# Patient Record
Sex: Female | Born: 1980 | Race: White | Hispanic: No | Marital: Married | Smoking: Never smoker
Health system: Southern US, Community
[De-identification: ages and names within clinical notes are randomized; demographics above are authoritative.]

## PROBLEM LIST (undated history)

## (undated) DIAGNOSIS — A630 Anogenital (venereal) warts: Secondary | ICD-10-CM

## (undated) DIAGNOSIS — E7211 Homocystinuria: Secondary | ICD-10-CM

## (undated) DIAGNOSIS — E7212 Methylenetetrahydrofolate reductase deficiency: Secondary | ICD-10-CM

## (undated) HISTORY — PX: DILATION AND CURETTAGE OF UTERUS: SHX78

## (undated) HISTORY — DX: Anogenital (venereal) warts: A63.0

## (undated) HISTORY — DX: Methylenetetrahydrofolate reductase deficiency: E72.12

## (undated) HISTORY — DX: Homocystinuria: E72.11

---

## 2011-08-17 ENCOUNTER — Encounter (HOSPITAL_COMMUNITY): Payer: Self-pay | Admitting: Emergency Medicine

## 2011-08-17 ENCOUNTER — Emergency Department (HOSPITAL_COMMUNITY)
Admission: EM | Admit: 2011-08-17 | Discharge: 2011-08-17 | Disposition: A | Discharge status: Home / Self Care | Payer: BC Managed Care – PPO | Source: Home / Self Care | Attending: Emergency Medicine | Admitting: Emergency Medicine

## 2011-08-17 DIAGNOSIS — IMO0002 Reserved for concepts with insufficient information to code with codable children: Secondary | ICD-10-CM

## 2011-08-17 DIAGNOSIS — T148XXA Other injury of unspecified body region, initial encounter: Secondary | ICD-10-CM

## 2011-08-17 MED ORDER — TRAMADOL HCL 50 MG PO TABS: 100.0000 mg | ORAL_TABLET | Freq: Three times a day (TID) | ORAL | Status: AC | PRN

## 2011-08-17 NOTE — ED Notes (Signed)
Last tetanus December 2012

## 2011-08-17 NOTE — ED Notes (Signed)
Left index finger tip injured with a knife while preparing meal.  Small area of avulsion on finger tip.  Bleeding controlled .  Incident occurred around 5:30 tonight.  Rinsed with water and applied neosporin .  Patient was cutting an onion.

## 2011-08-17 NOTE — ED Notes (Signed)
Finger soaking in

## 2011-08-17 NOTE — ED Notes (Signed)
Finger has been soaked in chlorehexadine

## 2011-08-17 NOTE — Discharge Instructions (Signed)

## 2011-08-17 NOTE — ED Provider Notes (Addendum)
Chief Complaint  Patient presents with  . 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 She has a small area of avulsion of a portion the fingertip. Bleeding has been controlled. Her last Tdap vaccine was in December of last year.  Review of Systems:  Other than noted above, the patient denies any of the following symptoms: Systemic:  No fever or chills. Musculoskeletal:  No joint pain or decreased range of motion. Neuro:  No numbness, tingling, or weakness.  PMFSH:  Past medical history, family history, social history, meds, and allergies were reviewed.  Physical Exam:   Vital signs:  BP 129/80  Pulse 84  Temp(Src) 98.5 F (36.9 C) (Oral)  Resp 18  SpO2 99%  LMP 08/10/2011 Ext:  There was a tiny area of avulsion on the left index finger involving the finger nail measuring no more than 8 mm in size.  All joints had a full ROM without pain.  Pulses were full.  Good capillary refill in all digits.  No edema. Neurological:  Alert and oriented.  No muscle weakness.  Sensation was intact to light touch.   Assessment:  The encounter diagnosis was Laceration. This does not require suturing. Antibiotic ointment was applied and a sterile dressing. She was instructed in wound care.  Plan:   1.  The following meds were prescribed:   New Prescriptions   TRAMADOL (ULTRAM) 50 MG TABLET    Take 2 tablets (100 mg total) by mouth every 8 (eight) hours as needed for pain.   TRAMADOL (ULTRAM) 50 MG TABLET    Take 2 tablets (100 mg total) by mouth every 8 (eight) hours as needed for pain.   2.  The patient was instructed in wound care and pain control, and handouts were given.   Reuben Likes, MD 08/17/11 2025  Reuben Likes, MD 08/20/11 1101  Reuben Likes, MD 08/20/11 1101

## 2012-11-23 ENCOUNTER — Other Ambulatory Visit (HOSPITAL_COMMUNITY): Payer: Self-pay | Admitting: Obstetrics

## 2012-11-23 DIAGNOSIS — IMO0002 Reserved for concepts with insufficient information to code with codable children: Secondary | ICD-10-CM

## 2012-11-29 ENCOUNTER — Ambulatory Visit (HOSPITAL_COMMUNITY)
Admission: RE | Admit: 2012-11-29 | Discharge: 2012-11-29 | Disposition: A | Discharge status: Home / Self Care | Payer: BC Managed Care – PPO | Source: Ambulatory Visit | Attending: Obstetrics | Admitting: Obstetrics

## 2012-11-29 DIAGNOSIS — N979 Female infertility, unspecified: Secondary | ICD-10-CM | POA: Insufficient documentation

## 2012-11-29 DIAGNOSIS — IMO0002 Reserved for concepts with insufficient information to code with codable children: Secondary | ICD-10-CM

## 2012-11-29 MED ORDER — IOHEXOL 300 MG/ML  SOLN
10.0000 mL | Freq: Once | INTRAMUSCULAR | Status: AC | PRN
  Administered 2012-11-29: 10 mL

## 2014-03-03 LAB — OB RESULTS CONSOLE ANTIBODY SCREEN: Antibody Screen: POSITIVE

## 2014-03-03 LAB — OB RESULTS CONSOLE ABO/RH: RH Type: NEGATIVE

## 2014-03-03 LAB — OB RESULTS CONSOLE HIV ANTIBODY (ROUTINE TESTING): HIV: NONREACTIVE

## 2014-03-03 LAB — OB RESULTS CONSOLE RUBELLA ANTIBODY, IGM: RUBELLA: IMMUNE

## 2014-03-03 LAB — OB RESULTS CONSOLE RPR: RPR: NONREACTIVE

## 2014-03-03 LAB — OB RESULTS CONSOLE HEPATITIS B SURFACE ANTIGEN: HEP B S AG: NEGATIVE

## 2014-03-17 NOTE — L&D Delivery Note (Signed)
Delivery Note At 5:14 PM a viable and healthy female was delivered via Vaginal, Spontaneous Delivery (Presentation: Right Occiput Anterior).  APGAR: 8, 9; weight  pending.   Placenta status: Intact, Spontaneous.  Cord: 3 vessels with the following complications: None.  Cord pH: na  Anesthesia: Epidural  Episiotomy:  none Lacerations:  third Suture Repair: 2.0 3.0 vicryl rapide, 0 vicryl Est. Blood Loss (mL):  300  Mom to postpartum.  Baby to Couplet care / Skin to Skin.  Starlena Beil J 10/03/2014, 6:00 PM

## 2014-03-21 LAB — OB RESULTS CONSOLE GC/CHLAMYDIA
CHLAMYDIA, DNA PROBE: NEGATIVE
GC PROBE AMP, GENITAL: NEGATIVE

## 2014-09-07 LAB — OB RESULTS CONSOLE GBS: GBS: NEGATIVE

## 2014-10-02 ENCOUNTER — Encounter (HOSPITAL_COMMUNITY): Payer: Self-pay | Admitting: *Deleted

## 2014-10-02 ENCOUNTER — Inpatient Hospital Stay (HOSPITAL_COMMUNITY)
Admission: AD | Admit: 2014-10-02 | Discharge: 2014-10-05 | DRG: 988 | Disposition: A | Discharge status: Home / Self Care | Payer: BLUE CROSS/BLUE SHIELD | Source: Ambulatory Visit | Attending: Obstetrics and Gynecology | Admitting: Obstetrics and Gynecology

## 2014-10-02 DIAGNOSIS — O09819 Supervision of pregnancy resulting from assisted reproductive technology, unspecified trimester: Secondary | ICD-10-CM | POA: Diagnosis not present

## 2014-10-02 DIAGNOSIS — O365930 Maternal care for other known or suspected poor fetal growth, third trimester, not applicable or unspecified: Principal | ICD-10-CM | POA: Diagnosis present

## 2014-10-02 DIAGNOSIS — O9902 Anemia complicating childbirth: Secondary | ICD-10-CM | POA: Diagnosis present

## 2014-10-02 DIAGNOSIS — O4103X0 Oligohydramnios, third trimester, not applicable or unspecified: Secondary | ICD-10-CM | POA: Diagnosis present

## 2014-10-02 DIAGNOSIS — O26899 Other specified pregnancy related conditions, unspecified trimester: Secondary | ICD-10-CM | POA: Diagnosis present

## 2014-10-02 DIAGNOSIS — Z3A39 39 weeks gestation of pregnancy: Secondary | ICD-10-CM | POA: Diagnosis present

## 2014-10-02 DIAGNOSIS — D62 Acute posthemorrhagic anemia: Secondary | ICD-10-CM | POA: Diagnosis not present

## 2014-10-02 DIAGNOSIS — Z6791 Unspecified blood type, Rh negative: Secondary | ICD-10-CM

## 2014-10-02 DIAGNOSIS — IMO0002 Reserved for concepts with insufficient information to code with codable children: Secondary | ICD-10-CM | POA: Diagnosis present

## 2014-10-02 MED ORDER — LIDOCAINE HCL (PF) 1 % IJ SOLN
30.0000 mL | INTRAMUSCULAR | Status: DC | PRN
  Administered 2014-10-03: 30 mL via SUBCUTANEOUS
  Filled 2014-10-02: qty 30

## 2014-10-02 MED ORDER — LACTATED RINGERS IV SOLN
INTRAVENOUS | Status: DC
  Administered 2014-10-03: 16:00:00 via INTRAVENOUS

## 2014-10-02 MED ORDER — OXYTOCIN 40 UNITS IN LACTATED RINGERS INFUSION - SIMPLE MED
1.0000 m[IU]/min | INTRAVENOUS | Status: DC
  Administered 2014-10-03: 2 m[IU]/min via INTRAVENOUS
  Filled 2014-10-02: qty 1000

## 2014-10-02 MED ORDER — LACTATED RINGERS IV SOLN
500.0000 mL | INTRAVENOUS | Status: DC | PRN
  Administered 2014-10-03: 500 mL via INTRAVENOUS

## 2014-10-02 MED ORDER — ONDANSETRON HCL 4 MG/2ML IJ SOLN: 4.0000 mg | Freq: Four times a day (QID) | INTRAMUSCULAR | Status: DC | PRN

## 2014-10-02 MED ORDER — FLEET ENEMA 7-19 GM/118ML RE ENEM: 1.0000 | ENEMA | Freq: Every day | RECTAL | Status: DC | PRN

## 2014-10-02 MED ORDER — OXYCODONE-ACETAMINOPHEN 5-325 MG PO TABS: 1.0000 | ORAL_TABLET | ORAL | Status: DC | PRN

## 2014-10-02 MED ORDER — ZOLPIDEM TARTRATE 5 MG PO TABS: 5.0000 mg | ORAL_TABLET | Freq: Every evening | ORAL | Status: DC | PRN

## 2014-10-02 MED ORDER — MISOPROSTOL 25 MCG QUARTER TABLET
25.0000 ug | ORAL_TABLET | ORAL | Status: DC | PRN
  Administered 2014-10-03 (×2): 25 ug via VAGINAL
  Filled 2014-10-02 (×2): qty 0.25
  Filled 2014-10-02: qty 1

## 2014-10-02 MED ORDER — OXYTOCIN 40 UNITS IN LACTATED RINGERS INFUSION - SIMPLE MED
62.5000 mL/h | INTRAVENOUS | Status: DC
  Administered 2014-10-03: 62.5 mL/h via INTRAVENOUS

## 2014-10-02 MED ORDER — TERBUTALINE SULFATE 1 MG/ML IJ SOLN: 0.2500 mg | Freq: Once | INTRAMUSCULAR | Status: AC | PRN

## 2014-10-02 MED ORDER — OXYCODONE-ACETAMINOPHEN 5-325 MG PO TABS: 2.0000 | ORAL_TABLET | ORAL | Status: DC | PRN

## 2014-10-02 MED ORDER — OXYTOCIN BOLUS FROM INFUSION: 500.0000 mL | INTRAVENOUS | Status: DC

## 2014-10-02 MED ORDER — ACETAMINOPHEN 325 MG PO TABS: 650.0000 mg | ORAL_TABLET | ORAL | Status: DC | PRN

## 2014-10-02 MED ORDER — CITRIC ACID-SODIUM CITRATE 334-500 MG/5ML PO SOLN
30.0000 mL | ORAL | Status: DC | PRN
  Administered 2014-10-03: 30 mL via ORAL
  Filled 2014-10-02: qty 15

## 2014-10-02 NOTE — H&P (Signed)
Rachel Hernandez is a 34 y.o. female presenting for IOL for IUGR and oligo.  Maternal Medical History:  Contractions: Onset was less than 1 hour ago.   Frequency: rare.   Perceived severity is mild.    Fetal activity: Perceived fetal activity is normal.   Last perceived fetal movement was within the past hour.    Prenatal complications: IUGR and oligohydramnios.   Prenatal Complications - Diabetes: none.    OB History    No data available     No past medical history on file. No past surgical history on file. Family History: family history is not on file. Social History:  reports that she has never smoked. She does not have any smokeless tobacco history on file. She reports that she drinks alcohol. She reports that she does not use illicit drugs.   Prenatal Transfer Tool  Maternal Diabetes: No Genetic Screening: Normal Maternal Ultrasounds/Referrals: Normal Fetal Ultrasounds or other Referrals:  None Maternal Substance Abuse:  No Significant Maternal Medications:  None Significant Maternal Lab Results:  None Other Comments:  None  Review of Systems  Constitutional: Negative.   All other systems reviewed and are negative.     There were no vitals taken for this visit. Maternal Exam:  Uterine Assessment: Contraction strength is mild.  Contraction frequency is irregular.   Abdomen: Patient reports no abdominal tenderness. Fetal presentation: vertex  Introitus: Normal vulva. Normal vagina.  Ferning test: not done.  Nitrazine test: not done. Amniotic fluid character: not assessed.  Pelvis: adequate for delivery.   Cervix: Cervix evaluated by digital exam.     Physical Exam  Nursing note and vitals reviewed. Constitutional: She is oriented to person, place, and time. She appears well-developed and well-nourished.  HENT:  Head: Normocephalic and atraumatic.  Neck: Normal range of motion. Neck supple.  Cardiovascular: Normal rate and regular rhythm.   Respiratory:  Effort normal and breath sounds normal.  GI: Soft. Bowel sounds are normal.  Genitourinary: Vagina normal and uterus normal.  Musculoskeletal: Normal range of motion.  Neurological: She is alert and oriented to person, place, and time. She has normal reflexes.  Skin: Skin is warm and dry.  Psychiatric: She has a normal mood and affect. Her behavior is normal. Thought content normal.    Prenatal labs: ABO, Rh: A/Negative/-- (12/18 0000) Antibody: Positive (12/18 0000) Rubella: Immune (12/18 0000) RPR: Nonreactive (12/18 0000)  HBsAg: Negative (12/18 0000)  HIV: Non-reactive (12/18 0000)  GBS: Negative (06/23 0000)   Assessment/Plan: 39 weeks IUGR with lagging AC Oligo IVF pregnancy Proceed with IOL   Rachel Hernandez 10/02/2014, 10:39 PM

## 2014-10-03 ENCOUNTER — Inpatient Hospital Stay (HOSPITAL_COMMUNITY): Payer: BLUE CROSS/BLUE SHIELD | Admitting: Anesthesiology

## 2014-10-03 ENCOUNTER — Encounter (HOSPITAL_COMMUNITY): Payer: Self-pay | Admitting: *Deleted

## 2014-10-03 LAB — CBC
HCT: 38.9 % (ref 36.0–46.0)
Hemoglobin: 13 g/dL (ref 12.0–15.0)
MCH: 30 pg (ref 26.0–34.0)
MCHC: 33.4 g/dL (ref 30.0–36.0)
MCV: 89.6 fL (ref 78.0–100.0)
PLATELETS: 211 10*3/uL (ref 150–400)
RBC: 4.34 MIL/uL (ref 3.87–5.11)
RDW: 13.9 % (ref 11.5–15.5)
WBC: 15.4 10*3/uL — ABNORMAL HIGH (ref 4.0–10.5)

## 2014-10-03 LAB — RPR: RPR: NONREACTIVE

## 2014-10-03 MED ORDER — METHYLERGONOVINE MALEATE 0.2 MG PO TABS: 0.2000 mg | ORAL_TABLET | ORAL | Status: DC | PRN

## 2014-10-03 MED ORDER — WITCH HAZEL-GLYCERIN EX PADS: 1.0000 "application " | MEDICATED_PAD | CUTANEOUS | Status: DC | PRN

## 2014-10-03 MED ORDER — OXYCODONE-ACETAMINOPHEN 5-325 MG PO TABS
2.0000 | ORAL_TABLET | ORAL | Status: DC | PRN
  Administered 2014-10-04 (×2): 2 via ORAL
  Filled 2014-10-03 (×2): qty 2

## 2014-10-03 MED ORDER — SENNOSIDES-DOCUSATE SODIUM 8.6-50 MG PO TABS
2.0000 | ORAL_TABLET | ORAL | Status: DC
  Administered 2014-10-03 – 2014-10-04 (×2): 2 via ORAL
  Filled 2014-10-03 (×2): qty 2

## 2014-10-03 MED ORDER — DIPHENHYDRAMINE HCL 50 MG/ML IJ SOLN: 12.5000 mg | INTRAMUSCULAR | Status: DC | PRN

## 2014-10-03 MED ORDER — ONDANSETRON HCL 4 MG PO TABS
4.0000 mg | ORAL_TABLET | ORAL | Status: DC | PRN
Start: 2014-10-03 — End: 2014-10-05

## 2014-10-03 MED ORDER — TETANUS-DIPHTH-ACELL PERTUSSIS 5-2.5-18.5 LF-MCG/0.5 IM SUSP: 0.5000 mL | Freq: Once | INTRAMUSCULAR | Status: DC

## 2014-10-03 MED ORDER — DIPHENHYDRAMINE HCL 25 MG PO CAPS: 25.0000 mg | ORAL_CAPSULE | Freq: Four times a day (QID) | ORAL | Status: DC | PRN

## 2014-10-03 MED ORDER — ZOLPIDEM TARTRATE 5 MG PO TABS: 5.0000 mg | ORAL_TABLET | Freq: Every evening | ORAL | Status: DC | PRN

## 2014-10-03 MED ORDER — PRENATAL MULTIVITAMIN CH
1.0000 | ORAL_TABLET | Freq: Every day | ORAL | Status: DC
  Administered 2014-10-04 – 2014-10-05 (×2): 1 via ORAL
  Filled 2014-10-03 (×2): qty 1

## 2014-10-03 MED ORDER — IBUPROFEN 600 MG PO TABS
600.0000 mg | ORAL_TABLET | Freq: Four times a day (QID) | ORAL | Status: DC
  Administered 2014-10-03 – 2014-10-05 (×8): 600 mg via ORAL
  Filled 2014-10-03 (×8): qty 1

## 2014-10-03 MED ORDER — BENZOCAINE-MENTHOL 20-0.5 % EX AERO
1.0000 "application " | INHALATION_SPRAY | CUTANEOUS | Status: DC | PRN
  Filled 2014-10-03 (×2): qty 56

## 2014-10-03 MED ORDER — OXYCODONE-ACETAMINOPHEN 5-325 MG PO TABS
1.0000 | ORAL_TABLET | ORAL | Status: DC | PRN
  Administered 2014-10-04 – 2014-10-05 (×6): 1 via ORAL
  Filled 2014-10-03 (×6): qty 1

## 2014-10-03 MED ORDER — METHYLERGONOVINE MALEATE 0.2 MG/ML IJ SOLN: 0.2000 mg | INTRAMUSCULAR | Status: DC | PRN

## 2014-10-03 MED ORDER — LANOLIN HYDROUS EX OINT: TOPICAL_OINTMENT | CUTANEOUS | Status: DC | PRN

## 2014-10-03 MED ORDER — FENTANYL 2.5 MCG/ML BUPIVACAINE 1/10 % EPIDURAL INFUSION (WH - ANES)
14.0000 mL/h | INTRAMUSCULAR | Status: DC | PRN
  Administered 2014-10-03 (×2): 14 mL/h via EPIDURAL
  Filled 2014-10-03 (×2): qty 125

## 2014-10-03 MED ORDER — SIMETHICONE 80 MG PO CHEW: 80.0000 mg | CHEWABLE_TABLET | ORAL | Status: DC | PRN

## 2014-10-03 MED ORDER — ONDANSETRON HCL 4 MG/2ML IJ SOLN: 4.0000 mg | INTRAMUSCULAR | Status: DC | PRN

## 2014-10-03 MED ORDER — EPHEDRINE 5 MG/ML INJ
10.0000 mg | INTRAVENOUS | Status: DC | PRN
  Filled 2014-10-03: qty 2

## 2014-10-03 MED ORDER — LIDOCAINE HCL (PF) 1 % IJ SOLN
INTRAMUSCULAR | Status: DC | PRN
  Administered 2014-10-03: 2 mL
  Administered 2014-10-03 (×2): 4 mL

## 2014-10-03 MED ORDER — DIBUCAINE 1 % RE OINT: 1.0000 "application " | TOPICAL_OINTMENT | RECTAL | Status: DC | PRN

## 2014-10-03 MED ORDER — ACETAMINOPHEN 325 MG PO TABS: 650.0000 mg | ORAL_TABLET | ORAL | Status: DC | PRN

## 2014-10-03 MED ORDER — PHENYLEPHRINE 40 MCG/ML (10ML) SYRINGE FOR IV PUSH (FOR BLOOD PRESSURE SUPPORT)
80.0000 ug | PREFILLED_SYRINGE | INTRAVENOUS | Status: DC | PRN
  Filled 2014-10-03: qty 2
  Filled 2014-10-03: qty 20

## 2014-10-03 NOTE — Anesthesia Procedure Notes (Deleted)
Epidural Patient location during procedure: OB Start time: 10/03/2014 10:23 AM  Staffing Anesthesiologist: Parissa Chiao Performed by: anesthesiologist   Preanesthetic Checklist Completed: patient identified, site marked, surgical consent, pre-op evaluation, timeout performed, IV checked, risks and benefits discussed and monitors and equipment checked  Epidural Patient position: sitting Prep: site prepped and draped and DuraPrep Patient monitoring: continuous pulse ox and blood pressure Approach: midline Injection technique: LOR air  Needle:  Needle type: Tuohy  Needle gauge: 17 G Needle length: 9 cm and 9 Needle insertion depth: 5 cm cm Catheter type: closed end flexible Catheter size: 19 Gauge Catheter at skin depth: 10 cm Test dose: negative  Assessment Events: blood not aspirated, injection not painful, no injection resistance, negative IV test and no paresthesia

## 2014-10-03 NOTE — Anesthesia Preprocedure Evaluation (Signed)
Anesthesia Evaluation  Patient identified by MRN, date of birth, ID band Patient awake    Reviewed: Allergy & Precautions, H&P , NPO status , Patient's Chart, lab work & pertinent test results, reviewed documented beta blocker date and time   Airway Mallampati: II  TM Distance: >3 FB Neck ROM: full    Dental no notable dental hx.    Pulmonary neg pulmonary ROS,  breath sounds clear to auscultation  Pulmonary exam normal       Cardiovascular negative cardio ROS Normal cardiovascular examRhythm:regular Rate:Normal     Neuro/Psych negative neurological ROS  negative psych ROS   GI/Hepatic negative GI ROS, Neg liver ROS,   Endo/Other  negative endocrine ROS  Renal/GU negative Renal ROS  negative genitourinary   Musculoskeletal   Abdominal   Peds  Hematology negative hematology ROS (+)   Anesthesia Other Findings NPO appropriate, allergies reviewed Denies active cardiac or pulmonary symptoms, METS > 4 No recent congestive cough or symptoms of upper respiratory infection Meds - none   Reproductive/Obstetrics (+) Pregnancy                             Anesthesia Physical Anesthesia Plan  ASA: II  Anesthesia Plan: Epidural   Post-op Pain Management:    Induction:   Airway Management Planned:   Additional Equipment:   Intra-op Plan:   Post-operative Plan:   Informed Consent: I have reviewed the patients History and Physical, chart, labs and discussed the procedure including the risks, benefits and alternatives for the proposed anesthesia with the patient or authorized representative who has indicated his/her understanding and acceptance.     Plan Discussed with:   Anesthesia Plan Comments:         Anesthesia Quick Evaluation

## 2014-10-03 NOTE — Anesthesia Preprocedure Evaluation (Deleted)
Anesthesia Evaluation  Patient identified by MRN, date of birth, ID band Patient awake    Reviewed: Allergy & Precautions, H&P , NPO status , Patient's Chart, lab work & pertinent test results, reviewed documented beta blocker date and time   Airway Mallampati: II  TM Distance: >3 FB Neck ROM: full    Dental no notable dental hx.    Pulmonary neg pulmonary ROS,  breath sounds clear to auscultation  Pulmonary exam normal       Cardiovascular negative cardio ROS Normal cardiovascular examRhythm:regular Rate:Normal     Neuro/Psych negative neurological ROS  negative psych ROS   GI/Hepatic negative GI ROS, Neg liver ROS,   Endo/Other  negative endocrine ROS  Renal/GU negative Renal ROS  negative genitourinary   Musculoskeletal   Abdominal   Peds  Hematology negative hematology ROS (+)   Anesthesia Other Findings   Reproductive/Obstetrics (+) Pregnancy                             Anesthesia Physical Anesthesia Plan  ASA: II  Anesthesia Plan: Epidural   Post-op Pain Management:    Induction:   Airway Management Planned:   Additional Equipment:   Intra-op Plan:   Post-operative Plan:   Informed Consent: I have reviewed the patients History and Physical, chart, labs and discussed the procedure including the risks, benefits and alternatives for the proposed anesthesia with the patient or authorized representative who has indicated his/her understanding and acceptance.     Plan Discussed with:   Anesthesia Plan Comments:         Anesthesia Quick Evaluation

## 2014-10-03 NOTE — Anesthesia Procedure Notes (Signed)
Epidural Patient location during procedure: OB  Staffing Anesthesiologist: Traeton Bordas Performed by: anesthesiologist   Preanesthetic Checklist Completed: patient identified, site marked, surgical consent, pre-op evaluation, timeout performed, IV checked, risks and benefits discussed and monitors and equipment checked  Epidural Patient position: sitting Prep: site prepped and draped and DuraPrep Patient monitoring: continuous pulse ox and blood pressure Approach: midline Location: L3-L4 Injection technique: LOR air  Needle:  Needle type: Tuohy  Needle gauge: 17 G Needle length: 9 cm and 9 Needle insertion depth: 5 cm cm Catheter type: closed end flexible Catheter size: 19 Gauge Catheter at skin depth: 9 cm Test dose: negative  Assessment Events: blood not aspirated, injection not painful, no injection resistance, negative IV test and no paresthesia  Additional Notes Patient identified. Risks/Benefits/Options discussed with patient including but not limited to bleeding, infection, nerve damage, paralysis, failed block, incomplete pain control, headache, blood pressure changes, nausea, vomiting, reactions to medication both or allergic, itching and postpartum back pain. Confirmed with bedside nurse the patient's most recent platelet count. Confirmed with patient that they are not currently taking any anticoagulation, have any bleeding history or any family history of bleeding disorders. Patient expressed understanding and wished to proceed. All questions were answered. Sterile technique was used throughout the entire procedure. Please see nursing notes for vital signs. Test dose was given through epidural catheter and negative prior to continuing to dose epidural or start infusion. Warning signs of high block given to the patient including shortness of breath, tingling/numbness in hands, complete motor block, or any concerning symptoms with instructions to call for help. Patient was  given instructions on fall risk and not to get out of bed. All questions and concerns addressed with instructions to call with any issues or inadequate analgesia.

## 2014-10-03 NOTE — Plan of Care (Signed)
Problem: Consults Goal: Birthing Suites Patient Information Press F2 to bring up selections list   Pt 37-[redacted] weeks EGA and Inpatient induction

## 2014-10-03 NOTE — Progress Notes (Signed)
Rachel Hernandez is a 34 y.o. G3P0020 at 10051w2d by LMP admitted for induction of labor due to Poor fetal growth.  Subjective: comfortable  Objective: BP 122/68 mmHg  Pulse 72  Temp(Src) 97.9 F (36.6 C) (Oral)  Resp 18  Ht 5\' 8"  (1.727 m)  Wt 83.008 kg (183 lb)  BMI 27.83 kg/m2  LMP 01/01/2014      FHT:  FHR: 155 bpm, variability: moderate,  accelerations:  Present,  decelerations:  Absent UC:   irregular, every 7-10 minutes SVE:   Dilation: 4 Effacement (%): 70 Station: -1 Exam by:: Rachel Hernandez  AROM - clear  Labs: Lab Results  Component Value Date   WBC 15.4* 10/02/2014   HGB 13.0 10/02/2014   HCT 38.9 10/02/2014   MCV 89.6 10/02/2014   PLT 211 10/02/2014    Assessment / Plan: Induction of labor due to IUGR,  progressing well on pitocin  Labor: Progressing normally Preeclampsia:  no signs or symptoms of toxicity Fetal Wellbeing:  Category I Pain Control:  Labor support without medications I/D:  n/a Anticipated MOD:  NSVD  Rachel Hernandez J 10/03/2014, 8:01 AM

## 2014-10-03 NOTE — Progress Notes (Signed)
Rachel Hernandez is a 34 y.o. G3P0020 at [redacted]w[redacted]d by LMP admitted for induction of labor due to Low amniotic fluid. and Poor fetal growth.  Subjective: Feels occ pressure  Objective: BP 93/57 mmHg  Pulse 79  Temp(Src) 99 F (37.2 C) (Oral)  Resp 22  Ht 5\' 8"  (1.727 m)  Wt 83.008 kg (183 lb)  BMI 27.83 kg/m2  LMP 01/01/2014      FHT:  FHR: 145 bpm, variability: moderate,  accelerations:  Present,  decelerations:  Present occ variable UC:   regular, every 2-4 minutes SVE:   Dilation: 10 Effacement (%): 100 Station: +1, +2 Exam by:: Montez MoritaErin Hampton, RNC  Labs: Lab Results  Component Value Date   WBC 15.4* 10/02/2014   HGB 13.0 10/02/2014   HCT 38.9 10/02/2014   MCV 89.6 10/02/2014   PLT 211 10/02/2014    Assessment / Plan: Induction of labor due to IUGR,  progressing well on pitocin  Labor: Progressing normally Preeclampsia:  no signs or symptoms of toxicity Fetal Wellbeing:  Category I and Category II Pain Control:  Epidural I/D:  n/a Anticipated MOD:  NSVD  Vonnie Ligman J 10/03/2014, 2:49 PM

## 2014-10-04 LAB — CBC
HEMATOCRIT: 31.6 % — AB (ref 36.0–46.0)
Hemoglobin: 10.5 g/dL — ABNORMAL LOW (ref 12.0–15.0)
MCH: 30.3 pg (ref 26.0–34.0)
MCHC: 33.2 g/dL (ref 30.0–36.0)
MCV: 91.1 fL (ref 78.0–100.0)
PLATELETS: 165 10*3/uL (ref 150–400)
RBC: 3.47 MIL/uL — ABNORMAL LOW (ref 3.87–5.11)
RDW: 14.1 % (ref 11.5–15.5)
WBC: 14.1 10*3/uL — ABNORMAL HIGH (ref 4.0–10.5)

## 2014-10-04 MED ORDER — RHO D IMMUNE GLOBULIN 1500 UNIT/2ML IJ SOSY
300.0000 ug | PREFILLED_SYRINGE | Freq: Once | INTRAMUSCULAR | Status: AC
  Administered 2014-10-04: 300 ug via INTRAVENOUS
  Filled 2014-10-04: qty 2

## 2014-10-04 NOTE — Anesthesia Postprocedure Evaluation (Signed)
Anesthesia Post Note  Patient: Rachel SieveAlexis Hernandez  Procedure(s) Performed: * No procedures listed *  Anesthesia type: Epidural  Patient location: Mother/Baby  Post pain: Pain level controlled  Post assessment: Post-op Vital signs reviewed  Last Vitals:  Filed Vitals:   10/04/14 0915  BP: 117/57  Pulse: 102  Temp: 36.9 C  Resp: 18    Post vital signs: Reviewed  Level of consciousness:alert  Complications: No apparent anesthesia complications

## 2014-10-04 NOTE — Progress Notes (Signed)
Patient ID: Rachel Hernandez, female   DOB: 1980/12/03, 34 y.o.   MRN: [id number] PPD # 1 SVD with 3rd degree laceration  S:  Reports feeling very sore             Tolerating po/ No nausea or vomiting             Bleeding is light             Pain minimally controlled with ibuprofen (OTC) and narcotic analgesics including Percocet - only taking 1 tablet             Up ad lib / ambulatory / voiding without difficulties    Newborn  Information for the patient's newborn:  Biagio BorgLackey, Girl Jenilyn [[id number]][[id number]]  female  breast feeding     O:  A & O x 3, in no apparent distress              VS:  Filed Vitals:   10/03/14 2100 10/04/14 0100 10/04/14 0630 10/04/14 0915  BP: 107/61 115/60 110/65 117/57  Pulse: 94 92 85 102  Temp: 98.6 F (37 C) 98.9 F (37.2 C) 98.2 F (36.8 C) 98.4 F (36.9 C)  TempSrc: Oral Oral  Oral  Resp: 18 18 18 18   Height:      Weight:      SpO2: 99% 99%      LABS:  Recent Labs  10/02/14 2355 10/04/14 0530  WBC 15.4* 14.1*  HGB 13.0 10.5*  HCT 38.9 31.6*  PLT 211 165    Blood type: A NEG (07/20 0530) / Rhophylac indicated - infant Rh POS  Rubella: Immune (12/18 0000)   I&O: I/O last 3 completed shifts: In: -  Out: 1212 [Urine:600; Blood:612]             Lungs: Clear and unlabored  Heart: regular rate and rhythm / no murmurs  Abdomen: soft, non-tender, non-distended              Fundus: firm, non-tender, U-2  Perineum: 3rd degree repair healing well - no edema / ice pack in place  Lochia: minimal  Extremities: No edema, no calf pain or tenderness, No Homans    A/P: PPD # 1  34 y.o., [id number]   Principal Problem:    Postpartum care following vaginal delivery (7/19)  Active Problems:    IUGR (intrauterine growth restriction)   Doing well - stable status  Take Percocet 2 tablets and continue ibuprofen as scheduled  Routine post partum orders  Anticipate discharge tomorrow    Raelyn MoraAWSON, Bryley Chrisman, M, MSN, CNM 10/04/2014, 3:34 PM

## 2014-10-04 NOTE — Lactation Note (Signed)
This note was copied from the chart of Rachel Hernandez Saling. Lactation Consultation Note; RN called for assist with latch. Asking about using NS. Attempted to latch baby- she is doing some tongue thrusting and takes quite a while to get in a good sucking pattern on my gloved finger, Tried without NS and mom reports lots of pain with sucking- baby on and off the breast. Tried with NS, mom still reports pain. Colostrum noted on nipple when baby comes off the breast. Latched again to breast without NS and mom reports some better. RN gave manual pump- reviewed setup, use and cleaning of pump parts. Reviewed hand expression and mom easily able to hand express Colostrum. BF brochure given with resources for support after DC. No questions at present. To call for assist prn  Patient Name: Rachel Hernandez Riemann BJYNW'GToday's Date: 10/04/2014 Reason for consult: Initial assessment   Maternal Data Formula Feeding for Exclusion: No Has patient been taught Hand Expression?: Yes Does the patient have breastfeeding experience prior to this delivery?: No  Feeding Feeding Type: Breast Fed Length of feed: 15 min  LATCH Score/Interventions Latch: Repeated attempts needed to sustain latch, nipple held in mouth throughout feeding, stimulation needed to elicit sucking reflex. Intervention(s): Adjust position;Assist with latch;Breast massage  Audible Swallowing: None Intervention(s): Skin to skin  Type of Nipple: Everted at rest and after stimulation  Comfort (Breast/Nipple): Filling, red/small blisters or bruises, mild/mod discomfort  Problem noted: Mild/Moderate discomfort Interventions (Mild/moderate discomfort): Hand expression;Hand massage;Pre-pump if needed  Hold (Positioning): Assistance needed to correctly position infant at breast and maintain latch. Intervention(s): Breastfeeding basics reviewed  LATCH Score: 5  Lactation Tools Discussed/Used Tools: Nipple Dorris CarnesShields;Pump Nipple shield size: 20 Breast  pump type: Manual WIC Program: No   Consult Status Consult Status: Follow-up Date: 10/04/14 Follow-up type: In-patient    Pamelia HoitWeeks, Sanaya Gwilliam D 10/04/2014, 10:39 AM

## 2014-10-04 NOTE — Lactation Note (Signed)
This note was copied from the chart of Rachel Hernandez. Lactation Consultation Note Follow up visit at 28 hours of age. Mom reports baby has not made feeding progress today.  Her nipples are sore and pink she is using coconut oil from home.  Mom is not able to hand express colostrum.  LC expressed 3mls and spoon fed to baby.  Baby noted visually when crying to have bowl shaped tongue with short tight posterior frenulum.   Baby more awake.  Instructed mom on hand pump and NS application, mom is able to return demonstration.  #24 use on right nipple.  Nipple is elongated vertically and not symmetrically round causing misshape with Nipple shield. Baby is not relaxed and breast and stiffens when latched to NS.  After several minutes baby relaxed and maintained feeding for 10 minutes.  Mom reports minimal pain.  No colostrum transfer confirmed at this feeding.  LC implemented LPT infant policy due to small size and poor feedings.  Mom is overwhelmed with information.  Steps written on board to limit feedings to 15-20 minutes at breast, pump with DEBP every 3 hours/ 8X/ 24 hrs.  Mom to give colostrum first and then formula as needed for 7-5210mls supplement with every feeding.  Mom left while using DEBP.  [facility] Patty who will follow up with mom on teaching and supplement.  FOB supportive but overwhelmed and not eager to wake baby for feedings.     Patient Name: Rachel Hernandez WUJWJ'XToday's Date: 10/04/2014 Reason for consult: Follow-up assessment;Breast/nipple pain;Difficult latch   Maternal Data Has patient been taught Hand Expression?: Yes Does the patient have breastfeeding experience prior to this delivery?: No  Feeding Feeding Type: Breast Fed  LATCH Score/Interventions Latch: Repeated attempts needed to sustain latch, nipple held in mouth throughout feeding, stimulation needed to elicit sucking reflex. Intervention(s): Skin to skin;Teach feeding cues;Waking  techniques Intervention(s): Breast compression;Breast massage  Audible Swallowing: None  Type of Nipple: Flat Intervention(s): Hand pump;Double electric pump  Comfort (Breast/Nipple): Filling, red/small blisters or bruises, mild/mod discomfort  Problem noted: Mild/Moderate discomfort Interventions (Mild/moderate discomfort): Hand massage;Hand expression;Pre-pump if needed;Post-pump;Breast shields  Hold (Positioning): Assistance needed to correctly position infant at breast and maintain latch. Intervention(s): Breastfeeding basics reviewed;Support Pillows;Position options;Skin to skin  LATCH Score: 4  Lactation Tools Discussed/Used Tools: Nipple Shields Nipple shield size: 24 Breast pump type: Double-Electric Breast Pump Pump Review: Setup, frequency, and cleaning Initiated by:: JS, mom will need review overwhelmed with teaching Date initiated:: 10/04/14   Consult Status Consult Status: Follow-up Date: 10/05/14 Follow-up type: In-patient    Hillman Attig, Arvella MerlesJana Lynn 10/04/2014, 9:35 PM

## 2014-10-05 DIAGNOSIS — O26899 Other specified pregnancy related conditions, unspecified trimester: Secondary | ICD-10-CM | POA: Diagnosis present

## 2014-10-05 DIAGNOSIS — D62 Acute posthemorrhagic anemia: Secondary | ICD-10-CM | POA: Diagnosis not present

## 2014-10-05 DIAGNOSIS — Z6791 Unspecified blood type, Rh negative: Secondary | ICD-10-CM

## 2014-10-05 LAB — RH IG WORKUP (INCLUDES ABO/RH)
ABO/RH(D): A NEG
Antibody Screen: POSITIVE
DAT, IGG: NEGATIVE
FETAL SCREEN: NEGATIVE
Gestational Age(Wks): 39
Unit division: 0

## 2014-10-05 MED ORDER — MAGNESIUM OXIDE 400 (241.3 MG) MG PO TABS
200.0000 mg | ORAL_TABLET | Freq: Every day | ORAL | Status: DC
  Administered 2014-10-05: 200 mg via ORAL
  Filled 2014-10-05: qty 0.5

## 2014-10-05 MED ORDER — IBUPROFEN 600 MG PO TABS: 600.0000 mg | ORAL_TABLET | Freq: Four times a day (QID) | ORAL | Status: DC

## 2014-10-05 MED ORDER — OXYCODONE-ACETAMINOPHEN 5-325 MG PO TABS: 1.0000 | ORAL_TABLET | ORAL | Status: DC | PRN

## 2014-10-05 MED ORDER — DOCUSATE SODIUM 100 MG PO CAPS: 100.0000 mg | ORAL_CAPSULE | Freq: Two times a day (BID) | ORAL | Status: DC

## 2014-10-05 MED ORDER — MAGNESIUM OXIDE 400 (241.3 MG) MG PO TABS: 200.0000 mg | ORAL_TABLET | Freq: Every day | ORAL | Status: DC

## 2014-10-05 NOTE — Lactation Note (Signed)
This note was copied from the chart of Rachel Anaijah Augsburger. Lactation Consultation Note; Mom has been bottle feeding formula since her nipples are so sore. Pumping, 2 [employer] completed. OP appointment made for Tuesday at 9 am. To call prn  Patient Name: Rachel Hernandez [id number] Date: 10/05/2014 Reason for consult: Follow-up assessment   Maternal Data    Feeding Feeding Type: Bottle Fed - Formula Nipple Type: Slow - flow  LATCH Score/Interventions                      Lactation Tools Discussed/Used     Consult Status Consult Status: Follow-up Date: 10/10/14 Follow-up type: Out-patient    Pamelia Hoit 10/05/2014, 9:34 AM

## 2014-10-05 NOTE — Discharge Summary (Signed)
Obstetric Discharge Summary Reason for Admission: induction of labor and [redacted] weeks gestation, Oligohydramnios, IUGR Prenatal Procedures: IVF pregnancy, oligo, IUGR Intrapartum Procedures: spontaneous vaginal delivery Postpartum Procedures: Rho(D) Ig Complications-Operative and Postpartum: 3rd degree perineal laceration HEMOGLOBIN  Date Value Ref Range Status  10/04/2014 10.5* 12.0 - 15.0 g/dL Final   HCT  Date Value Ref Range Status  10/04/2014 31.6* 36.0 - 46.0 % Final    Physical Exam:  General: alert, cooperative and no distress Lochia: appropriate Uterine Fundus: firm Incision: healing well, no significant drainage, no dehiscence, no significant erythema DVT Evaluation: No evidence of DVT seen on physical exam. Negative Homan's sign. No cords or calf tenderness. No significant calf/ankle edema.  Discharge Diagnoses: Term Pregnancy-delivered, ABL anemia, 3rd degree perineal laceration  Discharge Information: Date: 10/05/2014 Activity: pelvic rest Diet: routine Medications: PNV, Ibuprofen, Percocet and Colace, Mag Oxide Condition: stable Instructions: refer to practice specific booklet Discharge to: home Follow-up Information    Follow up with Lenoard Aden, MD. Schedule an appointment as soon as possible for a visit in 6 weeks.   Specialty:  Obstetrics and Gynecology   Contact information:   [address] [place] [address] [phone]       Newborn Data: Live born female on 10/03/14 Birth Weight: 6 lb 3.7 oz (2825 g) APGAR: 8, 9  Home with mother.  Levan Aloia, N 10/05/2014, 1:26 PM

## 2014-10-05 NOTE — Progress Notes (Signed)
PPD #2- SVD  Subjective:   Reports feeling well, perineum sore, ready for discharge Tolerating po/ No nausea or vomiting Bleeding is light Pain controlled with Motrin and Percocet Up ad lib / ambulatory / voiding without problems Newborn: breastfeeding     Objective:   VS: VS:  Filed Vitals:   10/04/14 0630 10/04/14 0915 10/04/14 1845 10/05/14 0619  BP: 110/65 117/57 104/65 102/59  Pulse: 85 102 91 95  Temp: 98.2 F (36.8 C) 98.4 F (36.9 C) 98.6 F (37 C) 98.5 F (36.9 C)  TempSrc:  Oral Oral Oral  Resp: Height:      Weight:      SpO2:   99%     LABS:  Recent Labs  10/02/14 2355 10/04/14 0530  WBC 15.4* 14.1*  HGB 13.0 10.5*  PLT 211 165   Blood type: --/--/A NEG (07/20 0530) Rubella: Immune (12/18 0000)                I&O: Intake/Output      07/20 0701 - 07/21 0700 07/21 0701 - 07/22 0700   Urine (mL/kg/hr)     Blood     Total Output       Net              Physical Exam: Alert and oriented X3 Abdomen: soft, non-tender, non-distended  Fundus: firm, non-tender, U-2 Perineum: Well approximated, no significant erythema, edema, or drainage; healing well. Lochia: small Extremities: no edema, no calf pain or tenderness    Assessment: PPD #2  G3P1021/ S/P:induced vaginal, 3rd degree perineal laceration ABL anemia Rh negative-Rhogam given Doing well - stable for discharge home   Plan: Discharge home Increase water and dietary fiber intake, avoid hard stools and straining w/BMs RX's:  Ibuprofen  po Q 6 hrs prn pain #30 Refill x 0 Percocet 5/325 1 to 2 po Q 4 hrs prn pain #30 Refill x 0 Colace 100 mg po bid #60, refill x1 Mag Oxide 200 mg po daily #30, refill x1 Miralax (OTC) prn if no BM after 3 days Routine pp visit in 6wks at [facility] booklet given    Donette Larry, N MSN, CNM 10/05/2014, 10:55 AM

## 2014-10-10 ENCOUNTER — Ambulatory Visit (HOSPITAL_COMMUNITY)
Admit: 2014-10-10 | Discharge: 2014-10-10 | Disposition: A | Discharge status: Home / Self Care | Payer: BLUE CROSS/BLUE SHIELD | Attending: Obstetrics and Gynecology | Admitting: Obstetrics and Gynecology

## 2014-10-10 NOTE — Lactation Note (Signed)
Lactation Consult  Mother's reason for visit:  Mother was scheduled for outpatient follow up on day of discharge.  Visit Type: feeding assessment Appointment Notes: Mother is having difficulty latching infant . She states she was fit with a #24 nipple shield while in the hospital. She has attempt to latch infant with and without the , but has no success,. Mother states that Peds Dr Roda Shutters referred her to see LC. Mother states that she also wants assistance. She states that her primary goal is to have infant to breastfeed.  Mother states that infant chomps on the tip of the nipple when she attempts to breastfeed.  Consult:  Initial Lactation Consultant:  Michel Bickers  ________________________________________________________________________    ________________________________________________________________________  Mother's Name: Rachel Hernandez Type of delivery:   Breastfeeding Experience:  none Maternal Medical Conditions:  none Maternal Medications:  Prenatal vits  ________________________________________________________________________  Breastfeeding History (Post Discharge)  Frequency of breastfeeding:  Attempt several times daily Duration of feeding:  5 mins         Breastmilk:  Volume 30-35   ml every 2-3 hours  Method:  Bottle,   Pumping  Type of pump:  Symphony Frequency:  Every 3 hours for 20 mins Volume:  40-50 ml  Infant Intake and Output Assessment  Voids:6   in 24 hrs.  Color:  Clear yellow Stools:7   in 24 hrs.  Color:  Yellow  ________________________________________________________________________  Maternal Breast Assessment  Breast:  Filling Nipple:  Erect, Reddened and Cracked Pain level:  3 Pain interventions:  Bra, Cream/oil and Expressed breast milk  _______________________________________________________________________ Feeding Assessment/Evaluation:  Mother has bilateral cracking at the back of the nipple shaft with very red and tender  nipples. Mother has been using #24 flanges and advised to switch to #27 for comfort.  Infant latched  on the left breast with a #24 nipple shield. No latch achieved. Infant was fed with Ebm/formula prior to visit at 6;30. Mother states she gave infant 35 ml.  Mothers nipples are erect . Suggested that we offer the bare breast. Infant latched , after adjusting infants lower jaw and rolling infants top lip up for wide flange, infant sustained latch for 15 mins. Infant transferred 12 ml. Infant latched to alternate breast using football hold and transferred another 10 ml. Mother states she is unsure if she can keep up breastfeeding and post pumping.  Lots of support and encouragement given to mother.    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  Audible swallowing:  2 = Spontaneous and intermittent  Type of nipple:  2 = Everted at rest and after stimulation  Comfort (Breast/Nipple):  0 = Engorged, cracked, bleeding, large blisters, severe discomfort  Hold (Positioning):  1 = Assistance needed to correctly position infant at breast and maintain latch  LATCH score:  6  Attached assessment:  Shallow  Lips flanged:  No.  Lips untucked:  Yes.    Suck assessment:  Displays both  Tools:  Nipple shield 24 mm Instructed on use and cleaning of tool:  Yes.    Pre-feed weight: 5-12.2, 2616  Post-feed weight: 5-12.7,2628 Amount transferred:12 ml  Additional Feeding Assessment -   Infant's oral assessment:  Variance  Positioning:  Cross cradle Right  breast  LATCH documentation:  Latch:  1 = Repeated attempts needed to sustain latch, nipple held in mouth throughout feeding, stimulation needed to elicit sucking  reflex.  Audible swallowing:  2 = Spontaneous and intermittent  Type of nipple:  2 = Everted at rest and after stimulation  Comfort (Breast/Nipple):  0 = Engorged, cracked, bleeding, large blisters, severe discomfort  Hold (Positioning):  1 = Assistance needed to correctly position infant at breast and maintain latch  LATCH score:  6  Attached assessment:  Shallow  Lips flanged:  Yes.    Lips untucked:  Yes.    Suck assessment:  Displays both   Pre-feed weight:  5-12.7,1228 Post-feed weight:  5-13.1, 2638 Amount transferred:  10 ml    Total amount transferred: 22 ml Total supplement given: 20 ml ebm with a bottle  Mother advised to begin to offer infant breast at each feeding. Advised to adjust infants latch for wide gape. Mother taught to roll infants top lip up and adjust infant lower jaw Mother also advised to do good breast compression while feeding to stimulate infants suckling desire  Suggested that mother supplement infant 30-45 ml after each feeding. Mother advised in growth spruts and infants need for more volume Mother to continue to post pump after each feeding for 20 mins Scheduled for an LC follow on August 3 at 9am Mother was given information on tongue revision to research with Dr Wilhemina Bonito in [place] Follow up with Ped in one week

## 2014-10-18 ENCOUNTER — Ambulatory Visit (HOSPITAL_COMMUNITY)
Admission: RE | Admit: 2014-10-18 | Discharge: 2014-10-18 | Disposition: A | Discharge status: Home / Self Care | Payer: BLUE CROSS/BLUE SHIELD | Source: Ambulatory Visit | Attending: Obstetrics and Gynecology | Admitting: Obstetrics and Gynecology

## 2014-10-18 NOTE — Lactation Note (Signed)
Lactation Consult Gestational Age: [redacted]w[redacted]d (At Birth) Birth Weight: 6 lb 3.7 oz (2825 g) Weight at Discharge: Weight: 5 lb 13 oz (2635 g)Date of Discharge: 10/05/2014 Filed Weights   10/03/14 1714 10/04/14 0100 10/05/14 0033  Weight: 6 lb 3.7 oz (2825 g) 6 lb 1.9 oz (2775 g) 5 lb 13 oz (2635 g)      Mother's reason for visit:  Follow up from last week- still needs help with latching baby to the breast Visit Type:  Feeding assessment Appointment Notes:  SN Consult:  Follow-Up Lactation Consultant:  Audry Riles D  ________________________________________________________________________    ________________________________________________________________________  Mother's Name: Rachel Hernandez Type of delivery:  vag Breastfeeding Experience:  P1  ________________________________________________________________________  Breastfeeding History (Post Discharge)  Frequency of breastfeeding:  occas- baby still pushing away from from breast and nipples are sore- but getting better   Supplementation  Formula:  Volume 0 ml  Breastmilk:  Volume 60 ml Frequency:  q feeding  Method:  Bottle,   Pumping  Type of pump:  Symphony- now using her own Spectra Frequency:  6 times/day Volume:   60-100 ml  Infant Intake and Output Assessment  Voids:  QS in 24 hrs.  Color:  Clear yellow Stools:  QS in 24 hrs.  Color:  Yellow  ________________________________________________________________________  Maternal Breast Assessment  Breast:  Filling Nipple:  Erect and Reddened  _______________________________________________________________________ Feeding Assessment/Evaluation  Initial feeding assessment:    Positioning:  Football Left breast  LATCH documentation:  Latch:  2 = Grasps breast easily, tongue down, lips flanged, rhythmical sucking.  Audible swallowing:  1 = A few with stimulation  Type of nipple:  2 = Everted at rest and after stimulation  Comfort  (Breast/Nipple):  1 = Filling, red/small blisters or bruises, mild/mod discomfort  Hold (Positioning):  1 = Assistance needed to correctly position infant at breast and maintain latch  LATCH score:  7  Attached assessment:  Deep  Lips flanged:  Yes.    Lips untucked:  No.  Suck assessment:  Displays both   Pre-feed weight:  2982 g  (6 lb. 9.2 oz.) Post-feed weight:  2994 g (6 lb. 9.6 oz.) Amount transferred:  12 ml Amount supplemented:  34 ml  Mom reports she is still having trouble getting Rachel Hernandez to latch well without pain. She continues to keep tongue to roof of mouth and doing some tongue thrusting. She did latch well and nursed for about 7-8 min. Then got fussy. Bottle fed EBM to calm. Would not latch to other breast- too fussy. Mom reports she is using APNO and nipples are beginning to feel much better- still pink but intact. Just got her Spectra pump yesterday- pumped here on office and reports it feel sdifferent than Medela but does not hurt. Flanges are appropriate size. No further questions at present. To call prn. Does not want to make another appointment at this time, Will call if needs another.  Total amount pumped post feed:  R 30 ml    L 30 ml  Total amount transferred:  12 ml Total supplement given:  34 ml

## 2014-10-20 ENCOUNTER — Ambulatory Visit (HOSPITAL_COMMUNITY): Payer: BLUE CROSS/BLUE SHIELD

## 2015-11-15 DIAGNOSIS — R87810 Cervical high risk human papillomavirus (HPV) DNA test positive: Secondary | ICD-10-CM | POA: Diagnosis not present

## 2015-11-15 DIAGNOSIS — Z01419 Encounter for gynecological examination (general) (routine) without abnormal findings: Secondary | ICD-10-CM | POA: Diagnosis not present

## 2015-11-15 DIAGNOSIS — Z6822 Body mass index (BMI) 22.0-22.9, adult: Secondary | ICD-10-CM | POA: Diagnosis not present

## 2015-11-23 DIAGNOSIS — Z13220 Encounter for screening for lipoid disorders: Secondary | ICD-10-CM | POA: Diagnosis not present

## 2015-12-28 DIAGNOSIS — Z3141 Encounter for fertility testing: Secondary | ICD-10-CM | POA: Diagnosis not present

## 2016-01-14 DIAGNOSIS — N979 Female infertility, unspecified: Secondary | ICD-10-CM | POA: Diagnosis not present

## 2016-01-14 DIAGNOSIS — Z3141 Encounter for fertility testing: Secondary | ICD-10-CM | POA: Diagnosis not present

## 2016-01-18 DIAGNOSIS — Z3141 Encounter for fertility testing: Secondary | ICD-10-CM | POA: Diagnosis not present

## 2016-01-21 DIAGNOSIS — Z3141 Encounter for fertility testing: Secondary | ICD-10-CM | POA: Diagnosis not present

## 2016-02-05 DIAGNOSIS — Z3200 Encounter for pregnancy test, result unknown: Secondary | ICD-10-CM | POA: Diagnosis not present

## 2016-02-06 DIAGNOSIS — Z3200 Encounter for pregnancy test, result unknown: Secondary | ICD-10-CM | POA: Diagnosis not present

## 2016-02-06 DIAGNOSIS — Z3141 Encounter for fertility testing: Secondary | ICD-10-CM | POA: Diagnosis not present

## 2016-02-08 DIAGNOSIS — Z3200 Encounter for pregnancy test, result unknown: Secondary | ICD-10-CM | POA: Diagnosis not present

## 2016-02-20 DIAGNOSIS — Z3A00 Weeks of gestation of pregnancy not specified: Secondary | ICD-10-CM | POA: Diagnosis not present

## 2016-02-20 DIAGNOSIS — O09811 Supervision of pregnancy resulting from assisted reproductive technology, first trimester: Secondary | ICD-10-CM | POA: Diagnosis not present

## 2016-03-03 DIAGNOSIS — Z3201 Encounter for pregnancy test, result positive: Secondary | ICD-10-CM | POA: Diagnosis not present

## 2016-03-04 DIAGNOSIS — Z3183 Encounter for assisted reproductive fertility procedure cycle: Secondary | ICD-10-CM | POA: Diagnosis not present

## 2016-03-17 NOTE — L&D Delivery Note (Signed)
Delivery Note At 12:56 PM a viable and healthy female was delivered via Vaginal, Spontaneous Delivery (Presentation: LOA).  APGAR: 9, 9; weight pending .   Placenta status: spontaneous, intact.  Cord:  with the following complications:none .  Cord pH: na  Anesthesia:   Episiotomy: Median Lacerations:   Suture Repair: 2.0 3.0 vicryl rapide Est. Blood Loss (mL): 250  Mom to postpartum.  Baby to Couplet care / Skin to Skin.  Avyukt Cimo J 10/15/2016, 1:42 PM

## 2016-03-21 DIAGNOSIS — Z3491 Encounter for supervision of normal pregnancy, unspecified, first trimester: Secondary | ICD-10-CM | POA: Diagnosis not present

## 2016-03-21 DIAGNOSIS — Z36 Encounter for antenatal screening for chromosomal anomalies: Secondary | ICD-10-CM | POA: Diagnosis not present

## 2016-03-21 DIAGNOSIS — Z3A10 10 weeks gestation of pregnancy: Secondary | ICD-10-CM | POA: Diagnosis not present

## 2016-03-21 DIAGNOSIS — Z3481 Encounter for supervision of other normal pregnancy, first trimester: Secondary | ICD-10-CM | POA: Diagnosis not present

## 2016-03-21 DIAGNOSIS — Z3689 Encounter for other specified antenatal screening: Secondary | ICD-10-CM | POA: Diagnosis not present

## 2016-03-21 DIAGNOSIS — O360110 Maternal care for anti-D [Rh] antibodies, first trimester, not applicable or unspecified: Secondary | ICD-10-CM | POA: Diagnosis not present

## 2016-03-21 DIAGNOSIS — O0901 Supervision of pregnancy with history of infertility, first trimester: Secondary | ICD-10-CM | POA: Diagnosis not present

## 2016-03-21 LAB — OB RESULTS CONSOLE RUBELLA ANTIBODY, IGM: Rubella: IMMUNE

## 2016-03-21 LAB — OB RESULTS CONSOLE GC/CHLAMYDIA
Chlamydia: NEGATIVE
Gonorrhea: NEGATIVE

## 2016-03-21 LAB — OB RESULTS CONSOLE ABO/RH: RH TYPE: NEGATIVE

## 2016-03-21 LAB — OB RESULTS CONSOLE RPR: RPR: NONREACTIVE

## 2016-03-21 LAB — OB RESULTS CONSOLE HIV ANTIBODY (ROUTINE TESTING): HIV: NONREACTIVE

## 2016-03-21 LAB — OB RESULTS CONSOLE HEPATITIS B SURFACE ANTIGEN: Hepatitis B Surface Ag: NEGATIVE

## 2016-04-08 DIAGNOSIS — Z23 Encounter for immunization: Secondary | ICD-10-CM | POA: Diagnosis not present

## 2016-04-08 DIAGNOSIS — Z3682 Encounter for antenatal screening for nuchal translucency: Secondary | ICD-10-CM | POA: Diagnosis not present

## 2016-05-02 DIAGNOSIS — Z361 Encounter for antenatal screening for raised alphafetoprotein level: Secondary | ICD-10-CM | POA: Diagnosis not present

## 2016-05-02 DIAGNOSIS — Z3A16 16 weeks gestation of pregnancy: Secondary | ICD-10-CM | POA: Diagnosis not present

## 2016-05-02 DIAGNOSIS — O0902 Supervision of pregnancy with history of infertility, second trimester: Secondary | ICD-10-CM | POA: Diagnosis not present

## 2016-05-14 DIAGNOSIS — Z3A18 18 weeks gestation of pregnancy: Secondary | ICD-10-CM | POA: Diagnosis not present

## 2016-05-14 DIAGNOSIS — O280 Abnormal hematological finding on antenatal screening of mother: Secondary | ICD-10-CM | POA: Diagnosis not present

## 2016-05-16 DIAGNOSIS — O0902 Supervision of pregnancy with history of infertility, second trimester: Secondary | ICD-10-CM | POA: Diagnosis not present

## 2016-05-16 DIAGNOSIS — Z3A18 18 weeks gestation of pregnancy: Secondary | ICD-10-CM | POA: Diagnosis not present

## 2016-05-27 DIAGNOSIS — Z362 Encounter for other antenatal screening follow-up: Secondary | ICD-10-CM | POA: Diagnosis not present

## 2016-05-27 DIAGNOSIS — Z3A19 19 weeks gestation of pregnancy: Secondary | ICD-10-CM | POA: Diagnosis not present

## 2016-05-27 DIAGNOSIS — O280 Abnormal hematological finding on antenatal screening of mother: Secondary | ICD-10-CM | POA: Diagnosis not present

## 2016-06-24 DIAGNOSIS — Z3A19 19 weeks gestation of pregnancy: Secondary | ICD-10-CM | POA: Diagnosis not present

## 2016-06-24 DIAGNOSIS — O280 Abnormal hematological finding on antenatal screening of mother: Secondary | ICD-10-CM | POA: Diagnosis not present

## 2016-07-01 DIAGNOSIS — O280 Abnormal hematological finding on antenatal screening of mother: Secondary | ICD-10-CM | POA: Diagnosis not present

## 2016-07-01 DIAGNOSIS — Z3A24 24 weeks gestation of pregnancy: Secondary | ICD-10-CM | POA: Diagnosis not present

## 2016-07-01 DIAGNOSIS — O09892 Supervision of other high risk pregnancies, second trimester: Secondary | ICD-10-CM | POA: Diagnosis not present

## 2016-07-01 DIAGNOSIS — O360110 Maternal care for anti-D [Rh] antibodies, first trimester, not applicable or unspecified: Secondary | ICD-10-CM | POA: Diagnosis not present

## 2016-07-08 DIAGNOSIS — O09892 Supervision of other high risk pregnancies, second trimester: Secondary | ICD-10-CM | POA: Diagnosis not present

## 2016-07-08 DIAGNOSIS — Z3A25 25 weeks gestation of pregnancy: Secondary | ICD-10-CM | POA: Diagnosis not present

## 2016-07-22 DIAGNOSIS — Z3A27 27 weeks gestation of pregnancy: Secondary | ICD-10-CM | POA: Diagnosis not present

## 2016-07-22 DIAGNOSIS — O09892 Supervision of other high risk pregnancies, second trimester: Secondary | ICD-10-CM | POA: Diagnosis not present

## 2016-07-22 DIAGNOSIS — Z3689 Encounter for other specified antenatal screening: Secondary | ICD-10-CM | POA: Diagnosis not present

## 2016-07-22 LAB — OB RESULTS CONSOLE ANTIBODY SCREEN: ANTIBODY SCREEN: POSITIVE

## 2016-08-05 DIAGNOSIS — Z3689 Encounter for other specified antenatal screening: Secondary | ICD-10-CM | POA: Diagnosis not present

## 2016-08-05 DIAGNOSIS — Z3A29 29 weeks gestation of pregnancy: Secondary | ICD-10-CM | POA: Diagnosis not present

## 2016-08-05 DIAGNOSIS — O09893 Supervision of other high risk pregnancies, third trimester: Secondary | ICD-10-CM | POA: Diagnosis not present

## 2016-08-05 DIAGNOSIS — Z23 Encounter for immunization: Secondary | ICD-10-CM | POA: Diagnosis not present

## 2016-08-20 DIAGNOSIS — Z3A32 32 weeks gestation of pregnancy: Secondary | ICD-10-CM | POA: Diagnosis not present

## 2016-08-20 DIAGNOSIS — O09893 Supervision of other high risk pregnancies, third trimester: Secondary | ICD-10-CM | POA: Diagnosis not present

## 2016-09-05 DIAGNOSIS — Z3A34 34 weeks gestation of pregnancy: Secondary | ICD-10-CM | POA: Diagnosis not present

## 2016-09-05 DIAGNOSIS — O09893 Supervision of other high risk pregnancies, third trimester: Secondary | ICD-10-CM | POA: Diagnosis not present

## 2016-09-12 DIAGNOSIS — Z3685 Encounter for antenatal screening for Streptococcus B: Secondary | ICD-10-CM | POA: Diagnosis not present

## 2016-09-12 DIAGNOSIS — O09893 Supervision of other high risk pregnancies, third trimester: Secondary | ICD-10-CM | POA: Diagnosis not present

## 2016-09-12 DIAGNOSIS — Z3A35 35 weeks gestation of pregnancy: Secondary | ICD-10-CM | POA: Diagnosis not present

## 2016-09-12 LAB — OB RESULTS CONSOLE GBS: STREP GROUP B AG: NEGATIVE

## 2016-09-22 DIAGNOSIS — O09893 Supervision of other high risk pregnancies, third trimester: Secondary | ICD-10-CM | POA: Diagnosis not present

## 2016-09-22 DIAGNOSIS — Z3A36 36 weeks gestation of pregnancy: Secondary | ICD-10-CM | POA: Diagnosis not present

## 2016-10-02 DIAGNOSIS — O09893 Supervision of other high risk pregnancies, third trimester: Secondary | ICD-10-CM | POA: Diagnosis not present

## 2016-10-02 DIAGNOSIS — Z3A38 38 weeks gestation of pregnancy: Secondary | ICD-10-CM | POA: Diagnosis not present

## 2016-10-06 ENCOUNTER — Encounter (HOSPITAL_COMMUNITY): Payer: Self-pay | Admitting: *Deleted

## 2016-10-06 ENCOUNTER — Other Ambulatory Visit: Payer: Self-pay | Admitting: Obstetrics and Gynecology

## 2016-10-06 ENCOUNTER — Telehealth (HOSPITAL_COMMUNITY): Payer: Self-pay | Admitting: *Deleted

## 2016-10-06 NOTE — Telephone Encounter (Signed)
Preadmission screen  

## 2016-10-08 DIAGNOSIS — O09893 Supervision of other high risk pregnancies, third trimester: Secondary | ICD-10-CM | POA: Diagnosis not present

## 2016-10-08 DIAGNOSIS — Z3A39 39 weeks gestation of pregnancy: Secondary | ICD-10-CM | POA: Diagnosis not present

## 2016-10-15 ENCOUNTER — Inpatient Hospital Stay (HOSPITAL_COMMUNITY)
Admission: AD | Admit: 2016-10-15 | Payer: BLUE CROSS/BLUE SHIELD | Source: Ambulatory Visit | Admitting: Obstetrics and Gynecology

## 2016-10-15 ENCOUNTER — Inpatient Hospital Stay (HOSPITAL_COMMUNITY): Payer: BLUE CROSS/BLUE SHIELD | Admitting: Anesthesiology

## 2016-10-15 ENCOUNTER — Encounter (HOSPITAL_COMMUNITY): Payer: Self-pay

## 2016-10-15 ENCOUNTER — Inpatient Hospital Stay (HOSPITAL_COMMUNITY)
Admission: RE | Admit: 2016-10-15 | Discharge: 2016-10-16 | DRG: 775 | Disposition: A | Discharge status: Home / Self Care | Payer: BLUE CROSS/BLUE SHIELD | Source: Ambulatory Visit | Attending: Obstetrics and Gynecology | Admitting: Obstetrics and Gynecology

## 2016-10-15 DIAGNOSIS — Z3A40 40 weeks gestation of pregnancy: Secondary | ICD-10-CM

## 2016-10-15 DIAGNOSIS — O99284 Endocrine, nutritional and metabolic diseases complicating childbirth: Secondary | ICD-10-CM | POA: Diagnosis present

## 2016-10-15 DIAGNOSIS — O26893 Other specified pregnancy related conditions, third trimester: Secondary | ICD-10-CM | POA: Diagnosis not present

## 2016-10-15 DIAGNOSIS — E7212 Methylenetetrahydrofolate reductase deficiency: Secondary | ICD-10-CM | POA: Diagnosis present

## 2016-10-15 DIAGNOSIS — Z3490 Encounter for supervision of normal pregnancy, unspecified, unspecified trimester: Secondary | ICD-10-CM | POA: Diagnosis present

## 2016-10-15 DIAGNOSIS — Z6791 Unspecified blood type, Rh negative: Secondary | ICD-10-CM

## 2016-10-15 DIAGNOSIS — O26899 Other specified pregnancy related conditions, unspecified trimester: Secondary | ICD-10-CM

## 2016-10-15 LAB — CBC
HEMATOCRIT: 38.3 % (ref 36.0–46.0)
HEMOGLOBIN: 12.9 g/dL (ref 12.0–15.0)
MCH: 30.2 pg (ref 26.0–34.0)
MCHC: 33.7 g/dL (ref 30.0–36.0)
MCV: 89.7 fL (ref 78.0–100.0)
Platelets: 188 10*3/uL (ref 150–400)
RBC: 4.27 MIL/uL (ref 3.87–5.11)
RDW: 13.5 % (ref 11.5–15.5)
WBC: 13.5 10*3/uL — AB (ref 4.0–10.5)

## 2016-10-15 LAB — RPR: RPR: NONREACTIVE

## 2016-10-15 LAB — TYPE AND SCREEN
ABO/RH(D): A NEG
ANTIBODY SCREEN: NEGATIVE

## 2016-10-15 MED ORDER — ONDANSETRON HCL 4 MG PO TABS: 4.0000 mg | ORAL_TABLET | ORAL | Status: DC | PRN

## 2016-10-15 MED ORDER — OXYTOCIN 40 UNITS IN LACTATED RINGERS INFUSION - SIMPLE MED
1.0000 m[IU]/min | INTRAVENOUS | Status: DC
  Administered 2016-10-15: 2 m[IU]/min via INTRAVENOUS
  Filled 2016-10-15: qty 1000

## 2016-10-15 MED ORDER — SODIUM CHLORIDE 0.9% FLUSH: 3.0000 mL | INTRAVENOUS | Status: DC | PRN

## 2016-10-15 MED ORDER — BENZOCAINE-MENTHOL 20-0.5 % EX AERO
1.0000 "application " | INHALATION_SPRAY | CUTANEOUS | Status: DC | PRN
  Administered 2016-10-15: 1 via TOPICAL
  Filled 2016-10-15: qty 56

## 2016-10-15 MED ORDER — DIPHENHYDRAMINE HCL 25 MG PO CAPS: 25.0000 mg | ORAL_CAPSULE | Freq: Four times a day (QID) | ORAL | Status: DC | PRN

## 2016-10-15 MED ORDER — SOD CITRATE-CITRIC ACID 500-334 MG/5ML PO SOLN: 30.0000 mL | ORAL | Status: DC | PRN

## 2016-10-15 MED ORDER — FENTANYL 2.5 MCG/ML BUPIVACAINE 1/10 % EPIDURAL INFUSION (WH - ANES)
14.0000 mL/h | INTRAMUSCULAR | Status: DC | PRN
  Administered 2016-10-15: 14 mL/h via EPIDURAL
  Filled 2016-10-15: qty 100

## 2016-10-15 MED ORDER — POLYETHYLENE GLYCOL 3350 17 G PO PACK
17.0000 g | PACK | Freq: Every day | ORAL | Status: DC
  Administered 2016-10-15 – 2016-10-16 (×2): 17 g via ORAL
  Filled 2016-10-15 (×4): qty 1

## 2016-10-15 MED ORDER — PHENYLEPHRINE 40 MCG/ML (10ML) SYRINGE FOR IV PUSH (FOR BLOOD PRESSURE SUPPORT)
80.0000 ug | PREFILLED_SYRINGE | INTRAVENOUS | Status: DC | PRN
  Filled 2016-10-15: qty 5
  Filled 2016-10-15: qty 10

## 2016-10-15 MED ORDER — ACETAMINOPHEN 325 MG PO TABS: 650.0000 mg | ORAL_TABLET | ORAL | Status: DC | PRN

## 2016-10-15 MED ORDER — ONDANSETRON HCL 4 MG/2ML IJ SOLN: 4.0000 mg | INTRAMUSCULAR | Status: DC | PRN

## 2016-10-15 MED ORDER — LIDOCAINE HCL (PF) 1 % IJ SOLN
INTRAMUSCULAR | Status: AC
  Administered 2016-10-15: 30 mL
  Filled 2016-10-15: qty 30

## 2016-10-15 MED ORDER — SIMETHICONE 80 MG PO CHEW: 80.0000 mg | CHEWABLE_TABLET | ORAL | Status: DC | PRN

## 2016-10-15 MED ORDER — PRENATAL MULTIVITAMIN CH
1.0000 | ORAL_TABLET | Freq: Every day | ORAL | Status: DC
  Administered 2016-10-16: 1 via ORAL
  Filled 2016-10-15: qty 1

## 2016-10-15 MED ORDER — LIDOCAINE HCL (PF) 1 % IJ SOLN
INTRAMUSCULAR | Status: DC | PRN
  Administered 2016-10-15 (×2): 5 mL via EPIDURAL

## 2016-10-15 MED ORDER — POLYETHYLENE GLYCOL 3350 17 G PO PACK
17.0000 g | PACK | Freq: Every day | ORAL | Status: DC
  Filled 2016-10-15: qty 1

## 2016-10-15 MED ORDER — SODIUM CHLORIDE 0.9 % IV SOLN: 250.0000 mL | INTRAVENOUS | Status: DC | PRN

## 2016-10-15 MED ORDER — SODIUM CHLORIDE 0.9% FLUSH: 3.0000 mL | Freq: Two times a day (BID) | INTRAVENOUS | Status: DC

## 2016-10-15 MED ORDER — TETANUS-DIPHTH-ACELL PERTUSSIS 5-2.5-18.5 LF-MCG/0.5 IM SUSP: 0.5000 mL | Freq: Once | INTRAMUSCULAR | Status: DC

## 2016-10-15 MED ORDER — OXYTOCIN 40 UNITS IN LACTATED RINGERS INFUSION - SIMPLE MED: 2.5000 [IU]/h | INTRAVENOUS | Status: DC

## 2016-10-15 MED ORDER — ZOLPIDEM TARTRATE 5 MG PO TABS: 5.0000 mg | ORAL_TABLET | Freq: Every evening | ORAL | Status: DC | PRN

## 2016-10-15 MED ORDER — ONDANSETRON HCL 4 MG/2ML IJ SOLN: 4.0000 mg | Freq: Four times a day (QID) | INTRAMUSCULAR | Status: DC | PRN

## 2016-10-15 MED ORDER — SENNOSIDES-DOCUSATE SODIUM 8.6-50 MG PO TABS
2.0000 | ORAL_TABLET | ORAL | Status: DC
  Administered 2016-10-16: 2 via ORAL
  Filled 2016-10-15: qty 2

## 2016-10-15 MED ORDER — METHYLERGONOVINE MALEATE 0.2 MG PO TABS: 0.2000 mg | ORAL_TABLET | ORAL | Status: DC | PRN

## 2016-10-15 MED ORDER — PHENYLEPHRINE 40 MCG/ML (10ML) SYRINGE FOR IV PUSH (FOR BLOOD PRESSURE SUPPORT)
80.0000 ug | PREFILLED_SYRINGE | INTRAVENOUS | Status: DC | PRN
  Filled 2016-10-15: qty 5

## 2016-10-15 MED ORDER — EPHEDRINE 5 MG/ML INJ
10.0000 mg | INTRAVENOUS | Status: DC | PRN
  Filled 2016-10-15: qty 2

## 2016-10-15 MED ORDER — DIBUCAINE 1 % RE OINT: 1.0000 "application " | TOPICAL_OINTMENT | RECTAL | Status: DC | PRN

## 2016-10-15 MED ORDER — TERBUTALINE SULFATE 1 MG/ML IJ SOLN
0.2500 mg | Freq: Once | INTRAMUSCULAR | Status: DC | PRN
  Filled 2016-10-15: qty 1

## 2016-10-15 MED ORDER — LACTATED RINGERS IV SOLN: 500.0000 mL | Freq: Once | INTRAVENOUS | Status: DC

## 2016-10-15 MED ORDER — DIPHENHYDRAMINE HCL 50 MG/ML IJ SOLN: 12.5000 mg | INTRAMUSCULAR | Status: DC | PRN

## 2016-10-15 MED ORDER — METHYLERGONOVINE MALEATE 0.2 MG/ML IJ SOLN: 0.2000 mg | INTRAMUSCULAR | Status: DC | PRN

## 2016-10-15 MED ORDER — OXYTOCIN BOLUS FROM INFUSION
500.0000 mL | Freq: Once | INTRAVENOUS | Status: AC
  Administered 2016-10-15: 500 mL via INTRAVENOUS

## 2016-10-15 MED ORDER — WITCH HAZEL-GLYCERIN EX PADS: 1.0000 "application " | MEDICATED_PAD | CUTANEOUS | Status: DC | PRN

## 2016-10-15 MED ORDER — IBUPROFEN 600 MG PO TABS
600.0000 mg | ORAL_TABLET | Freq: Four times a day (QID) | ORAL | Status: DC
  Administered 2016-10-15 – 2016-10-16 (×4): 600 mg via ORAL
  Filled 2016-10-15 (×4): qty 1

## 2016-10-15 MED ORDER — LACTATED RINGERS IV SOLN
500.0000 mL | INTRAVENOUS | Status: DC | PRN
  Administered 2016-10-15: 1000 mL via INTRAVENOUS

## 2016-10-15 MED ORDER — COCONUT OIL OIL
1.0000 "application " | TOPICAL_OIL | Status: DC | PRN
  Administered 2016-10-16: 1 via TOPICAL
  Filled 2016-10-15: qty 120

## 2016-10-15 NOTE — Anesthesia Postprocedure Evaluation (Signed)
Anesthesia Post Note  Patient: Rachel Hernandez  Procedure(s) Performed: * No procedures listed *     Patient location during evaluation: Mother Baby Anesthesia Type: Epidural Level of consciousness: awake and alert Pain management: pain level controlled Vital Signs Assessment: post-procedure vital signs reviewed and stable Respiratory status: spontaneous breathing, nonlabored ventilation and respiratory function stable Cardiovascular status: stable Postop Assessment: no headache, no backache and epidural receding Anesthetic complications: no    Last Vitals:  Vitals:   10/15/16 1324 10/15/16 1404  BP: 131/76 113/74  Pulse: (!) 101 81  Resp:  16  Temp:  37.1 C    Last Pain:  Vitals:   10/15/16 1404  TempSrc: Oral  PainSc:    Pain Goal:                 Rachel Hernandez

## 2016-10-15 NOTE — H&P (Signed)
Rachel Hernandez is a 36 y.o. female presenting for IOL. OB History    Gravida Para Term Preterm AB Living   4 2 2  0 2 2   SAB TAB Ectopic Multiple Live Births   2 0 0 0 2     Past Medical History:  Diagnosis Date  . HPV (human papilloma virus) anogenital infection   . 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 DILATION AND CURETTAGE OF UTERUS     Family History: family history is not on file. Social History:  reports that she has never smoked. She does not have any smokeless tobacco history on file. She reports that she does not drink alcohol or use drugs.     Maternal Diabetes: No Genetic Screening: Normal Maternal Ultrasounds/Referrals: Normal Fetal Ultrasounds or other Referrals:  None Maternal Substance Abuse:  No Significant Maternal Medications:  None Significant Maternal Lab Results:  None Other Comments:  None  Review of Systems  Constitutional: Negative.   All other systems reviewed and are negative.  Maternal Medical History:  Contractions: Onset was less than 1 hour ago.   Perceived severity is mild.    Fetal activity: Perceived fetal activity is normal.   Last perceived fetal movement was within the past hour.    Prenatal Complications - Diabetes: none.    Dilation: 10 Effacement (%): 100 Station: +2 Exam by:: Shubham Thackston Blood pressure 131/76, pulse (!) 101, temperature 98.8 F (37.1 C), temperature source Oral, resp. rate 16, SpO2 99 %, unknown if currently breastfeeding. Maternal Exam:  Uterine Assessment: Contraction strength is mild.  Contraction frequency is rare.   Abdomen: Patient reports no abdominal tenderness. Fetal presentation: vertex  Introitus: Normal vulva. Normal vagina.  Ferning test: not done.  Nitrazine test: not done. Amniotic fluid character: not assessed.  Pelvis: adequate for delivery.   Cervix: Cervix evaluated by digital exam.     Physical  Exam  Nursing note and vitals reviewed. Constitutional: She is oriented to person, place, and time. She appears well-developed and well-nourished.  HENT:  Head: Normocephalic and atraumatic.  Neck: Normal range of motion. Neck supple.  Cardiovascular: Normal rate and regular rhythm.   Respiratory: Effort normal and breath sounds normal.  GI: Soft. Bowel sounds are normal.  Genitourinary: Vagina normal and uterus normal.  Musculoskeletal: Normal range of motion.  Neurological: She is alert and oriented to person, place, and time.  Skin: Skin is warm and dry.    Prenatal labs: ABO, Rh: --/--/A NEG (08/01 0800) Antibody: NEG (08/01 0800) Rubella: Immune (01/05 0000) RPR: Non Reactive (08/01 0800)  HBsAg: Negative (01/05 0000)  HIV: Non-reactive (01/05 0000)  GBS: Negative (06/29 0000)   Assessment/Plan: 40w IOL History ivf   Cassadie Pankonin J 10/15/2016, 1:45 PM

## 2016-10-15 NOTE — Anesthesia Pain Management Evaluation Note (Addendum)
  CRNA Pain Management Visit Note  Patient: Rachel Hernandez, 36 y.o., female  "Hello I am a member of the anesthesia team at [facility]. We have an anesthesia team available at all times to provide care throughout the hospital, including epidural management and anesthesia for C-section. I don't know your plan for the delivery whether it a natural birth, water birth, IV sedation, nitrous supplementation, doula or epidural, but we want to meet your pain goals."   1.Was your pain managed to your expectations on prior hospitalizations?   Yes   2.What is your expectation for pain management during this hospitalization?     Epidural  3.How can we help you reach that goal? Epidural when I am in labor.  Record the patient's initial score and the patient's pain goal.   Pain: 0  Pain Goal: 5 [facility] wants you to be able to say your pain was always managed very well.  Devony Mcgrady 10/15/2016

## 2016-10-15 NOTE — Anesthesia Preprocedure Evaluation (Signed)

## 2016-10-15 NOTE — Lactation Note (Signed)
This note was copied from a baby's chart. Lactation Consultation Note  Patient Name: Rachel Hernandez ZOXWR'UToday's Date: 10/15/2016  Initial visit at 9 hours of life. Mom is a P2 who pumped & BO her 1st child for 4.5 months (that child is now 34 yo & had to be put on Nutramigen b/c of dairy allergies). Mom reports having had an abundant supply (she could pump more than 4 oz w/ease). Mom reports that her 1st child did not latch well & that she had a small tongue-tie that was not revised (Mom has MHTFR gene mutation). Mom reports that this infant seems to be doing better. Infant was sleeping contentedly in mom's arms, so latching was not attempted, but Mom's questions about positioning, etc., were answered. Specifics of an asymmetric latch shown via [employer] website animation.   Mom made aware of O/P services, breastfeeding support groups, community resources, and our phone # for post-discharge questions. Mom has a Spectra pump at home. Mom anticipates possible discharge early tomorrow afternoon.  Lurline HareRichey, [name] 10/15/2016, 10:50 PM

## 2016-10-15 NOTE — Anesthesia Procedure Notes (Addendum)
Epidural Patient location during procedure: OB Start time: 10/15/2016 10:15 AM End time: 10/15/2016 10:25 AM  Staffing Anesthesiologist: Diamante Truszkowski  Preanesthetic Checklist Completed: patient identified, site marked, surgical consent, pre-op evaluation, timeout performed, IV checked, risks and benefits discussed and monitors and equipment checked  Epidural Patient position: sitting Prep: site prepped and draped and DuraPrep Patient monitoring: continuous pulse ox and blood pressure Approach: midline Location: L4-L5 Injection technique: LOR air  Needle:  Needle type: Tuohy  Needle gauge: 17 G Needle length: 9 cm and 9 Needle insertion depth: 5 cm cm Catheter type: closed end flexible Catheter size: 19 Gauge Catheter at skin depth: 10 cm Test dose: negative  Assessment Events: blood not aspirated, injection not painful, no injection resistance, negative IV test and no paresthesia

## 2016-10-16 LAB — CBC
HEMATOCRIT: 33.2 % — AB (ref 36.0–46.0)
HEMOGLOBIN: 11.1 g/dL — AB (ref 12.0–15.0)
MCH: 30 pg (ref 26.0–34.0)
MCHC: 33.4 g/dL (ref 30.0–36.0)
MCV: 89.7 fL (ref 78.0–100.0)
Platelets: 151 10*3/uL (ref 150–400)
RBC: 3.7 MIL/uL — ABNORMAL LOW (ref 3.87–5.11)
RDW: 13.6 % (ref 11.5–15.5)
WBC: 10.8 10*3/uL — AB (ref 4.0–10.5)

## 2016-10-16 MED ORDER — DOCUSATE SODIUM 100 MG PO CAPS: 100.0000 mg | ORAL_CAPSULE | Freq: Two times a day (BID) | ORAL | 2 refills | Status: AC

## 2016-10-16 MED ORDER — FOLIC ACID 1 MG PO TABS: 1.0000 mg | ORAL_TABLET | Freq: Every day | ORAL | 0 refills | Status: DC

## 2016-10-16 MED ORDER — RHO D IMMUNE GLOBULIN 1500 UNIT/2ML IJ SOSY
300.0000 ug | PREFILLED_SYRINGE | Freq: Once | INTRAMUSCULAR | Status: AC
  Administered 2016-10-16: 300 ug via INTRAVENOUS
  Filled 2016-10-16: qty 2

## 2016-10-16 MED ORDER — IBUPROFEN 600 MG PO TABS: 600.0000 mg | ORAL_TABLET | Freq: Four times a day (QID) | ORAL | 0 refills | Status: DC

## 2016-10-16 NOTE — Progress Notes (Signed)
Admission nutrition screen triggered for unintentional weight loss > 10 lbs within the last month. The pt has not been weighed. The PNR shows steady weight gain with an overall weight gain of 37 lbs . Patients chart reviewed and assessed  for nutritional risk. Patient is determined to be at low nutrition  risk.   Elisabeth CaraKatherine Kaliana Albino M.Odis LusterEd. R.D. LDN Neonatal Nutrition Support Specialist/RD III Pager [phone]      Phone [phone]

## 2016-10-16 NOTE — Discharge Summary (Signed)
Obstetric Discharge Summary   Patient Name: Rachel Hernandez DOB: Oct 15, 1980 MRN: [id number]  Date of Admission: 10/15/2016 Date of Discharge: 10/16/2016 Date of Delivery: 10/15/16 Gestational Age at Delivery: 3556w0d  [facility] Dr. Billy Coastaavon   Antepartum complications: - MTHFR Mutation: on Baby ASA and Folic Acid - RH Negative - Hx. Of 3rd degree laceration  - IVF Pregnancy Prenatal Labs:  ABO, Rh: --/--/A NEG (08/01 0800) Antibody: NEG (08/01 0800) Rubella: Immune (01/05 0000) RPR: Non Reactive (08/01 0800)  HBsAg: Negative (01/05 0000)  HIV: Non-reactive (01/05 0000)  GBS: Negative (06/29 0000)  Admitting Diagnosis: Induction of Labor for IVF at 40 weeks   Secondary Diagnoses: Patient Active Problem List   Diagnosis Date Noted  . Encounter for induction of labor 10/15/2016  . SVD (spontaneous vaginal delivery) 10/15/2016  . Postpartum care following vaginal delivery (8/1) 10/15/2016  . Acute blood loss anemia 10/05/2014  . Rh negative status during pregnancy 10/05/2014  . Third degree perineal laceration 10/05/2014  . IUGR (intrauterine growth restriction) 10/02/2014    Augmentation: AROM, Pitocin  Complications: None  Date of Delivery: 10/15/16 Delivered By: Dr. Billy Coastaavon Delivery Type: spontaneous vaginal delivery Anesthesia: epidural Placenta: sponatneous Laceration: none Episiotomy: Median  Newborn Data: Live born female  Birth Weight: 7 lb 6 oz (3345 g) APGAR: 9, 9   Postpartum Course  (Vaginal Delivery): Patient had an uncomplicated postpartum course.  By time of discharge on PPD#1, her pain was controlled on oral pain medications; she had appropriate lochia and was ambulating, voiding without difficulty and tolerating regular diet.  She was deemed stable for discharge to home.    Labs: CBC Latest Ref Rng & Units 10/16/2016 10/15/2016 10/04/2014  WBC 4.0 - 10.5 K/uL 10.8(H) 13.5(H) 14.1(H)  Hemoglobin 12.0 - 15.0 g/dL 11.1(L) 12.9 10.5(L)   Hematocrit 36.0 - 46.0 % 33.2(L) 38.3 31.6(L)  Platelets 150 - 400 K/uL 151 188 165   A NEG  Physical exam:  BP 103/61 (BP Location: Right Arm)   Pulse 84   Temp 98.3 F (36.8 C) (Oral)   Resp 16   SpO2 98%   Breastfeeding? Unknown  General: alert and no distress Pulm: normal respiratory effort Lochia: appropriate Abdomen: soft, NT Uterine Fundus: firm, below umbilicus Perineum: median episiotomy healing well, no significant erythema, no significant edema Extremities: No evidence of DVT seen on physical exam. No lower extremity edema.  Disposition: stable, discharge to home Baby Feeding: breast milk Baby Disposition: home with mom  Contraception: unsure, hx of IVF   Rh Immune globulin given: given on /20/18 Rubella vaccine given: N/A Tdap vaccine given in AP or PP setting: UTD Flu vaccine given in AP or PP setting: UTD   Plan:  Rachel Sievelexis Amore was discharged to home in good condition. [facility].  Discharge Instructions: Per After Visit Summary. Activity: Advance as tolerated. Pelvic rest for 6 weeks.  Refer to After Visit Summary Diet: Regular, Heart Healthy Discharge Medications: Allergies as of 10/16/2016   No Known Allergies     Medication List    TAKE these medications   docusate sodium 100 MG capsule Commonly known as:  COLACE Take 1 capsule (100 mg total) by mouth 2 (two) times daily.   folic acid 1 MG tablet Commonly known as:  FOLVITE Take 1 tablet (1 mg total) by mouth daily. What changed:  how much to take   ibuprofen 600 MG tablet Commonly known as:  ADVIL,MOTRIN Take 1 tablet (600  mg total) by mouth every 6 (six) hours.   prenatal multivitamin Tabs tablet Take 1 tablet by mouth daily at 12 noon.      Outpatient follow up:  Follow-up Information    Olivia Mackieaavon, Richard, MD. Schedule an appointment as soon as possible for a visit in 6 week(s).   Specialty:  Obstetrics and Gynecology Why:  Postpartum  visit  Contact information: [address] [place] [id number] [phone]           Signed:  Carlean JewsMeredith Sigmon, MSN, CNM [facility]

## 2016-10-16 NOTE — Progress Notes (Signed)
PPD #1, SVD, median episiotomy, baby girl   S:  Reports feeling good             Tolerating po/ No nausea or vomiting / Denies dizziness or SOB             Bleeding is light             Pain controlled with Motrin and Dermoplast             Up ad lib / ambulatory / voiding QS  Newborn breast feeding - still working with lactation/latch, reports shallow latch with sore nipples, plans to f/u outpatient with lactation   O:               VS: BP 103/61 (BP Location: Right Arm)   Pulse 84   Temp 98.3 F (36.8 C) (Oral)   Resp 16   SpO2 98%   Breastfeeding? Unknown    LABS:              Recent Labs  10/15/16 0800 10/16/16 0528  WBC 13.5* 10.8*  HGB 12.9 11.1*  PLT 188 151               Blood type: --/--/A NEG (08/01 0800)  Rubella: Immune (01/05 0000)                     I&O: Intake/Output      08/01 0701 - 08/02 0700 08/02 0701 - 08/03 0700   Blood 250    Total Output 250     Net -250                        Physical Exam:             Alert and oriented X3  Lungs: Clear and unlabored  Heart: regular rate and rhythm / no mumurs  Abdomen: soft, non-tender, non-distended              Fundus: firm, non-tender, U-2  Perineum: well approximated midline episiotomy   Lochia: appropriate, no clots   Extremities: no edema, no calf pain or tenderness    A: PPD # 1  Median Episiotomy  RH Negative, baby A Pos - s/p Rhogam today  MTHFR mutation   Doing well - stable status  P: Routine post partum orders  Discharge home today   WOB discharge booklet and instructions reviewed   Pt. To continue Miralax and Colace   F/u with Dr. Billy Coastaavon in 6 weeks  Carlean JewsMeredith Sigmon, MSN, [facility]

## 2016-10-16 NOTE — Lactation Note (Signed)
This note was copied from a baby's chart. Lactation Consultation Note  P2, Mother wants help w/ latching.  Her nipples are tender.  She is using coconut oil and provided shells. Pumped and bottle fed first child due to sore nipples and difficult latch. Noted short labial frenulum and some mid posterior tightness but baby can protrude tongue. Baby has been spitty.   Assisted w/ positioning. Mother requested OP appt.  Made appt on 8/8 at 10 am. Mom encouraged to feed baby 8-12 times/24 hours and with feeding cues.  Encouraged calling for APNO if nipple soreness gets worse.  Discussed Hisaw for potential dx briefly. Reviewed engorgement care and monitoring voids/stools. Provided mother w/ #27 flange for hand pump.    Patient Name: Rachel Hernandez Reason for consult: Follow-up assessment   Maternal Data    Feeding Feeding Type: Breast Fed  LATCH Score Latch: Grasps breast easily, tongue down, lips flanged, rhythmical sucking.  Audible Swallowing: A few with stimulation  Type of Nipple: Everted at rest and after stimulation  Comfort (Breast/Nipple): Filling, red/small blisters or bruises, mild/mod discomfort  Hold (Positioning): Assistance needed to correctly position infant at breast and maintain latch.  LATCH Score: 7  Interventions    Lactation Tools Discussed/Used     Consult Status Consult Status: Follow-up Date: 10/17/16 Follow-up type: In-patient    [name], [name], 12:12 PM

## 2016-10-17 LAB — RH IG WORKUP (INCLUDES ABO/RH)
ABO/RH(D): A NEG
FETAL SCREEN: NEGATIVE
Gestational Age(Wks): 40
UNIT DIVISION: 0

## 2016-12-03 DIAGNOSIS — O872 Hemorrhoids in the puerperium: Secondary | ICD-10-CM | POA: Diagnosis not present

## 2016-12-09 DIAGNOSIS — Z124 Encounter for screening for malignant neoplasm of cervix: Secondary | ICD-10-CM | POA: Diagnosis not present

## 2016-12-24 DIAGNOSIS — Z23 Encounter for immunization: Secondary | ICD-10-CM | POA: Diagnosis not present

## 2017-01-08 DIAGNOSIS — N8110 Cystocele, unspecified: Secondary | ICD-10-CM | POA: Diagnosis not present

## 2017-02-11 DIAGNOSIS — R102 Pelvic and perineal pain: Secondary | ICD-10-CM | POA: Diagnosis not present

## 2017-02-11 DIAGNOSIS — N8110 Cystocele, unspecified: Secondary | ICD-10-CM | POA: Diagnosis not present

## 2017-02-11 DIAGNOSIS — M6281 Muscle weakness (generalized): Secondary | ICD-10-CM | POA: Diagnosis not present

## 2017-02-13 DIAGNOSIS — M6281 Muscle weakness (generalized): Secondary | ICD-10-CM | POA: Diagnosis not present

## 2017-02-13 DIAGNOSIS — N8110 Cystocele, unspecified: Secondary | ICD-10-CM | POA: Diagnosis not present

## 2017-02-13 DIAGNOSIS — R102 Pelvic and perineal pain: Secondary | ICD-10-CM | POA: Diagnosis not present

## 2017-02-16 DIAGNOSIS — Z3189 Encounter for other procreative management: Secondary | ICD-10-CM | POA: Diagnosis not present

## 2017-02-20 DIAGNOSIS — R102 Pelvic and perineal pain: Secondary | ICD-10-CM | POA: Diagnosis not present

## 2017-02-20 DIAGNOSIS — M6281 Muscle weakness (generalized): Secondary | ICD-10-CM | POA: Diagnosis not present

## 2017-02-20 DIAGNOSIS — N8110 Cystocele, unspecified: Secondary | ICD-10-CM | POA: Diagnosis not present

## 2017-03-06 DIAGNOSIS — M6281 Muscle weakness (generalized): Secondary | ICD-10-CM | POA: Diagnosis not present

## 2017-03-06 DIAGNOSIS — R102 Pelvic and perineal pain: Secondary | ICD-10-CM | POA: Diagnosis not present

## 2017-03-06 DIAGNOSIS — N8110 Cystocele, unspecified: Secondary | ICD-10-CM | POA: Diagnosis not present

## 2017-03-19 DIAGNOSIS — J209 Acute bronchitis, unspecified: Secondary | ICD-10-CM | POA: Diagnosis not present

## 2017-05-06 DIAGNOSIS — J209 Acute bronchitis, unspecified: Secondary | ICD-10-CM | POA: Diagnosis not present

## 2017-05-06 DIAGNOSIS — R062 Wheezing: Secondary | ICD-10-CM | POA: Diagnosis not present

## 2017-06-29 DIAGNOSIS — H5213 Myopia, bilateral: Secondary | ICD-10-CM | POA: Diagnosis not present

## 2017-07-30 DIAGNOSIS — R102 Pelvic and perineal pain: Secondary | ICD-10-CM | POA: Diagnosis not present

## 2017-07-30 DIAGNOSIS — N8110 Cystocele, unspecified: Secondary | ICD-10-CM | POA: Diagnosis not present

## 2017-07-30 DIAGNOSIS — M62838 Other muscle spasm: Secondary | ICD-10-CM | POA: Diagnosis not present

## 2017-07-30 DIAGNOSIS — M6281 Muscle weakness (generalized): Secondary | ICD-10-CM | POA: Diagnosis not present

## 2017-11-04 DIAGNOSIS — R102 Pelvic and perineal pain: Secondary | ICD-10-CM | POA: Diagnosis not present

## 2017-11-04 DIAGNOSIS — M6281 Muscle weakness (generalized): Secondary | ICD-10-CM | POA: Diagnosis not present

## 2017-11-04 DIAGNOSIS — N8110 Cystocele, unspecified: Secondary | ICD-10-CM | POA: Diagnosis not present

## 2017-11-04 DIAGNOSIS — M62838 Other muscle spasm: Secondary | ICD-10-CM | POA: Diagnosis not present

## 2017-11-19 DIAGNOSIS — R102 Pelvic and perineal pain: Secondary | ICD-10-CM | POA: Diagnosis not present

## 2017-11-19 DIAGNOSIS — N8110 Cystocele, unspecified: Secondary | ICD-10-CM | POA: Diagnosis not present

## 2017-11-19 DIAGNOSIS — M62838 Other muscle spasm: Secondary | ICD-10-CM | POA: Diagnosis not present

## 2017-11-19 DIAGNOSIS — M6281 Muscle weakness (generalized): Secondary | ICD-10-CM | POA: Diagnosis not present

## 2017-12-03 DIAGNOSIS — M62838 Other muscle spasm: Secondary | ICD-10-CM | POA: Diagnosis not present

## 2017-12-03 DIAGNOSIS — M6281 Muscle weakness (generalized): Secondary | ICD-10-CM | POA: Diagnosis not present

## 2017-12-03 DIAGNOSIS — M6289 Other specified disorders of muscle: Secondary | ICD-10-CM | POA: Diagnosis not present

## 2017-12-03 DIAGNOSIS — N8110 Cystocele, unspecified: Secondary | ICD-10-CM | POA: Diagnosis not present

## 2017-12-23 DIAGNOSIS — Z1151 Encounter for screening for human papillomavirus (HPV): Secondary | ICD-10-CM | POA: Diagnosis not present

## 2017-12-23 DIAGNOSIS — Z6821 Body mass index (BMI) 21.0-21.9, adult: Secondary | ICD-10-CM | POA: Diagnosis not present

## 2017-12-23 DIAGNOSIS — Z01419 Encounter for gynecological examination (general) (routine) without abnormal findings: Secondary | ICD-10-CM | POA: Diagnosis not present

## 2017-12-30 LAB — HM PAP SMEAR

## 2018-01-05 DIAGNOSIS — M6289 Other specified disorders of muscle: Secondary | ICD-10-CM | POA: Diagnosis not present

## 2018-01-05 DIAGNOSIS — M6281 Muscle weakness (generalized): Secondary | ICD-10-CM | POA: Diagnosis not present

## 2018-01-05 DIAGNOSIS — M62838 Other muscle spasm: Secondary | ICD-10-CM | POA: Diagnosis not present

## 2018-01-05 DIAGNOSIS — N8110 Cystocele, unspecified: Secondary | ICD-10-CM | POA: Diagnosis not present

## 2018-01-07 ENCOUNTER — Telehealth (HOSPITAL_COMMUNITY): Payer: Self-pay | Admitting: Lactation Services

## 2018-01-07 NOTE — Telephone Encounter (Signed)
Patient called b/c she knows that it is safe to receive a flu vaccine when breastfeeding, but had questions about specific vaccine varieties.   Glenetta Hew, RN, IBCLC

## 2018-01-13 ENCOUNTER — Telehealth (HOSPITAL_COMMUNITY): Payer: Self-pay | Admitting: Lactation Services

## 2018-01-13 NOTE — Telephone Encounter (Signed)
Rachel Hernandez called requesting information following a dental procedure.  Lidocaine and articaine were both used.  Questions regarding breastfeeding safety after these local anesthetics.  Information shared from [facility] Medication and Mother's Milk, and Lactmed site, saying no reported adverse affect following medication due to low transfer to human milk.   Mom only breastfeeding night and mornings, so Mom will wait until morning to breastfeed baby.

## 2018-03-04 DIAGNOSIS — L821 Other seborrheic keratosis: Secondary | ICD-10-CM | POA: Diagnosis not present

## 2018-03-04 DIAGNOSIS — D225 Melanocytic nevi of trunk: Secondary | ICD-10-CM | POA: Diagnosis not present

## 2018-03-04 DIAGNOSIS — L814 Other melanin hyperpigmentation: Secondary | ICD-10-CM | POA: Diagnosis not present

## 2018-03-04 DIAGNOSIS — L858 Other specified epidermal thickening: Secondary | ICD-10-CM | POA: Diagnosis not present

## 2018-03-04 DIAGNOSIS — D485 Neoplasm of uncertain behavior of skin: Secondary | ICD-10-CM | POA: Diagnosis not present

## 2018-03-04 DIAGNOSIS — D229 Melanocytic nevi, unspecified: Secondary | ICD-10-CM | POA: Diagnosis not present

## 2018-05-07 DIAGNOSIS — Z3189 Encounter for other procreative management: Secondary | ICD-10-CM | POA: Diagnosis not present

## 2018-07-27 DIAGNOSIS — H5213 Myopia, bilateral: Secondary | ICD-10-CM | POA: Diagnosis not present

## 2019-01-06 DIAGNOSIS — Z6822 Body mass index (BMI) 22.0-22.9, adult: Secondary | ICD-10-CM | POA: Diagnosis not present

## 2019-01-06 DIAGNOSIS — Z124 Encounter for screening for malignant neoplasm of cervix: Secondary | ICD-10-CM | POA: Diagnosis not present

## 2019-01-06 DIAGNOSIS — N8110 Cystocele, unspecified: Secondary | ICD-10-CM | POA: Diagnosis not present

## 2019-01-06 DIAGNOSIS — Z01419 Encounter for gynecological examination (general) (routine) without abnormal findings: Secondary | ICD-10-CM | POA: Diagnosis not present

## 2019-03-01 DIAGNOSIS — Z20828 Contact with and (suspected) exposure to other viral communicable diseases: Secondary | ICD-10-CM | POA: Diagnosis not present

## 2019-03-07 ENCOUNTER — Ambulatory Visit (HOSPITAL_COMMUNITY)
Admission: EM | Admit: 2019-03-07 | Discharge: 2019-03-07 | Disposition: A | Discharge status: Home / Self Care | Payer: BC Managed Care – PPO | Attending: Family Medicine | Admitting: Family Medicine

## 2019-03-07 ENCOUNTER — Encounter (HOSPITAL_COMMUNITY): Payer: Self-pay | Admitting: Emergency Medicine

## 2019-03-07 ENCOUNTER — Other Ambulatory Visit: Payer: Self-pay

## 2019-03-07 ENCOUNTER — Ambulatory Visit (INDEPENDENT_AMBULATORY_CARE_PROVIDER_SITE_OTHER): Payer: BC Managed Care – PPO

## 2019-03-07 DIAGNOSIS — R05 Cough: Secondary | ICD-10-CM | POA: Diagnosis not present

## 2019-03-07 DIAGNOSIS — Z20828 Contact with and (suspected) exposure to other viral communicable diseases: Secondary | ICD-10-CM | POA: Insufficient documentation

## 2019-03-07 DIAGNOSIS — R059 Cough, unspecified: Secondary | ICD-10-CM

## 2019-03-07 MED ORDER — HYDROCODONE-HOMATROPINE 5-1.5 MG/5ML PO SYRP: 5.0000 mL | ORAL_SOLUTION | Freq: Four times a day (QID) | ORAL | 0 refills | Status: DC | PRN

## 2019-03-07 MED ORDER — PREDNISONE 10 MG (21) PO TBPK: ORAL_TABLET | Freq: Every day | ORAL | 0 refills | Status: DC

## 2019-03-07 NOTE — ED Notes (Signed)
Bed: UC07 Expected date:  Expected time:  Means of arrival:  Comments: Hold for 1130 appt

## 2019-03-07 NOTE — Discharge Instructions (Signed)
Be aware, the cough medication may cause drowsiness. Please do not drive, operate heavy machinery or make important decisions while on this medication.

## 2019-03-07 NOTE — ED Triage Notes (Signed)
Reports cold symptoms around thanksgiving.  Patient reports cough improved, but did not go away.  over the past week day, patient"s cough has worsened and "crackly".  One day last week had chills

## 2019-03-07 NOTE — ED Notes (Signed)
Nasal swab in lab.  Mentioned abn vital signs to dr hagler

## 2019-03-09 LAB — NOVEL CORONAVIRUS, NAA (HOSP ORDER, SEND-OUT TO REF LAB; TAT 18-24 HRS): SARS-CoV-2, NAA: NOT DETECTED

## 2019-03-09 NOTE — ED Provider Notes (Signed)
[facility]   [id number] 03/07/19 Arrival Time: 6195  ASSESSMENT & PLAN:  1. Cough     See AVS for discharge instructions.  Meds ordered this encounter  Medications  . HYDROcodone-homatropine (HYCODAN) 5-1.5 MG/5ML syrup    Sig: Take 5 mLs by mouth every 6 (six) hours as needed for cough.    Dispense:  90 mL    Refill:  0  . predniSONE (STERAPRED UNI-PAK 21 TAB) 10 MG (21) TBPK tablet    Sig: Take by mouth daily. Take as directed.    Dispense:  21 tablet    Refill:  0   Question if some bronchospasm could be playing a role in her cough. She would like to try a short course of prednisone in addition to cough suppressant. Cough medication sedation precautions. OTC symptom care as needed. Ensure adequate fluid intake and rest. May f/u with PCP or here as needed.   Follow-up Information    [facility].   Specialty: Urgent Care Why: If symptoms worsen in any way. Contact information: [address] [phone]          Reviewed expectations re: course of current medical issues. Questions answered. Outlined signs and symptoms indicating need for more acute intervention. Patient verbalized understanding. After Visit Summary given.   SUBJECTIVE: History from: patient.  Rachel Hernandez is a 38 y.o. female who presents with complaint of a persistent dry cough. Initially with mild symptoms over Thanksgiving; seemed to resolve. Over the past week the cough has returned and is more frequent; mostly dry and non-productive. "But I do feel like there are crackles in my throat or upper chest". No sore throat reported. Mild fatigue. No significant body aches. Tested negative for COVID on 03/07/2019. SOB: none. Wheezing: none. Fever: absent; one day of chills around onset of current cough; none since. Overall normal PO intake without n/v. No specific or significant aggravating or alleviating factors  reported. Coughing does affect sleep. No CP or palpitations. OTC treatment: Delsym without relief.   Social History   Tobacco Use  Smoking Status Never Smoker    ROS: As per HPI. All other systems negative.    OBJECTIVE:  Vitals:   03/07/19 1152  BP: (!) 141/90  Pulse: (!) 119  Resp: (!) 22  Temp: 99 F (37.2 C)  TempSrc: Oral  SpO2: 96%    Recheck RR: 18 General appearance: alert;NAD HEENT: mild nasal congestion Neck: supple without LAD CV: tachycardic; regular Lungs: unlabored respirations, symmetrical air entry without appreciable wheezing; cough: marked, dry Abd: soft Ext: no LE edema Skin: warm and dry Neuro: normal gait Psychological: alert and cooperative; normal mood and affect  Imaging: DG Chest 2 View  Result Date: 03/07/2019 CLINICAL DATA:  Cough and wheezing EXAM: CHEST - 2 VIEW COMPARISON:  None. FINDINGS: No edema or consolidation. Heart size and pulmonary vascular normal. No adenopathy. No bone lesions. IMPRESSION: No edema or consolidation. Electronically Signed   By: Lowella Grip III M.D.   On: 03/07/2019 13:04    No Known Allergies  Past Medical History:  Diagnosis Date  . HPV (human papilloma virus) anogenital infection   . 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 Pulmonary embolism Mother   . Diabetes Father   . Atrial fibrillation Father    Social History   Socioeconomic History  . Marital status: Married  Spouse name: Not on file  . Number of children: Not on file  . Years of education: Not on file  . Highest education level: Not on file  Occupational History  . Not on file  Tobacco Use  . Smoking status: Never Smoker  Substance and Sexual Activity  . Alcohol use: No  . Drug use: No  . Sexual activity: Yes    Birth control/protection: None  Other Topics Concern  . Not on file  Social History Narrative  . Not on file   Social  Determinants of Health   Financial Resource Strain:   . Difficulty of Paying Living Expenses: Not on file  Food Insecurity:   . Worried About Programme researcher, broadcasting/film/video in the Last Year: Not on file  . Ran Out of Food in the Last Year: Not on file  Transportation Needs:   . Lack of Transportation (Medical): Not on file  . Lack of Transportation (Non-Medical): Not on file  Physical Activity:   . Days of Exercise per Week: Not on file  . Minutes of Exercise per Session: Not on file  Stress:   . Feeling of Stress : Not on file  Social Connections:   . Frequency of Communication with Friends and Family: Not on file  . Frequency of Social Gatherings with Friends and Family: Not on file  . Attends Religious Services: Not on file  . Active Member of Clubs or Organizations: Not on file  . Attends Banker Meetings: Not on file  . Marital Status: Not on file  Intimate Partner Violence:   . Fear of Current or Ex-Partner: Not on file  . Emotionally Abused: Not on file  . Physically Abused: Not on file  . Sexually Abused: Not on file           Mardella Layman, MD 03/09/19 1047

## 2019-04-14 DIAGNOSIS — L819 Disorder of pigmentation, unspecified: Secondary | ICD-10-CM | POA: Diagnosis not present

## 2019-04-14 DIAGNOSIS — L821 Other seborrheic keratosis: Secondary | ICD-10-CM | POA: Diagnosis not present

## 2019-04-14 DIAGNOSIS — L700 Acne vulgaris: Secondary | ICD-10-CM | POA: Diagnosis not present

## 2019-04-14 DIAGNOSIS — D229 Melanocytic nevi, unspecified: Secondary | ICD-10-CM | POA: Diagnosis not present

## 2019-05-11 LAB — TSH: TSH: 1.49 (ref <=5.90)

## 2019-05-11 LAB — HEPATIC FUNCTION PANEL
ALT: 14 (ref 7–35)
AST: 15 (ref 13–35)
Alkaline Phosphatase: 39 (ref 25–125)
Bilirubin, Total: 0.8

## 2019-05-11 LAB — COMPREHENSIVE METABOLIC PANEL
Albumin: 4.4 (ref 3.5–5.0)
Calcium: 9.5 (ref 8.7–10.7)

## 2019-05-11 LAB — LIPID PANEL
Cholesterol: 190 (ref 0–200)
HDL: 48 (ref 35–70)
LDL Cholesterol: 133
Triglycerides: 44 (ref 40–160)

## 2019-05-11 LAB — BASIC METABOLIC PANEL
BUN: 12 (ref 4–21)
CO2: 26 — AB (ref 13–22)
Chloride: 105 (ref 99–108)
Creatinine: 0.7 (ref 0.5–1.1)
Glucose: 83
Potassium: 4.4 (ref 3.4–5.3)
Sodium: 139 (ref 137–147)

## 2019-05-11 LAB — HM HEPATITIS C SCREENING LAB: HM Hepatitis Screen: NEGATIVE

## 2019-06-02 ENCOUNTER — Ambulatory Visit: Payer: BC Managed Care – PPO | Attending: Internal Medicine

## 2019-06-02 DIAGNOSIS — Z23 Encounter for immunization: Secondary | ICD-10-CM

## 2019-06-02 NOTE — Progress Notes (Signed)
   Covid-19 Vaccination Clinic  Name:  Rachel Hernandez    MRN: [id number] DOB: Aug 13, 1980  06/02/2019  Ms. Graham was observed post Covid-19 immunization for 15 minutes without incident. She was provided with Vaccine Information Sheet and instruction to access the V-Safe system.   [name] was instructed to call 911 with any severe reactions post vaccine: Marland Kitchen Difficulty breathing  . Swelling of face and throat  . A fast heartbeat  . A bad rash all over body  . Dizziness and weakness   Immunizations Administered    Name Date Dose VIS Date Route   Pfizer COVID-19 Vaccine 06/02/2019  9:03 AM 0.3 mL 02/25/2019 Intramuscular   Manufacturer: [employer], [employer]   Lot: [id number]   NDC: [address]-2

## 2019-06-27 ENCOUNTER — Ambulatory Visit: Payer: BC Managed Care – PPO | Attending: Internal Medicine

## 2019-06-27 DIAGNOSIS — Z23 Encounter for immunization: Secondary | ICD-10-CM

## 2019-06-27 NOTE — Progress Notes (Signed)
   Covid-19 Vaccination Clinic  Name:  Rachel Hernandez    MRN: [id number] DOB: [birth year]  06/27/2019  Rachel Hernandez was observed post Covid-19 immunization for 15 minutes without incident. She was provided with Vaccine Information Sheet and instruction to access the V-Safe system.   Rachel Hernandez was instructed to call 911 with any severe reactions post vaccine: Marland Kitchen Difficulty breathing  . Swelling of face and throat  . A fast heartbeat  . A bad rash all over body  . Dizziness and weakness   Immunizations Administered    Name Date Dose VIS Date Route   Pfizer COVID-19 Vaccine 06/27/2019  8:59 AM 0.3 mL 02/25/2019 Intramuscular   Manufacturer: [employer], [employer]   Lot: [id number]   NDC: [address]-3

## 2019-10-27 DIAGNOSIS — Z20822 Contact with and (suspected) exposure to covid-19: Secondary | ICD-10-CM | POA: Diagnosis not present

## 2020-03-27 ENCOUNTER — Ambulatory Visit: Payer: BC Managed Care – PPO | Admitting: Internal Medicine

## 2020-04-09 ENCOUNTER — Other Ambulatory Visit: Payer: Self-pay

## 2020-04-10 ENCOUNTER — Ambulatory Visit: Payer: BC Managed Care – PPO | Admitting: Internal Medicine

## 2020-04-10 ENCOUNTER — Encounter: Payer: Self-pay | Admitting: Internal Medicine

## 2020-04-10 VITALS — BP 106/72 | HR 80 | Temp 98.5°F | Resp 16 | Ht 68.0 in | Wt 145.0 lb

## 2020-04-10 DIAGNOSIS — Z23 Encounter for immunization: Secondary | ICD-10-CM

## 2020-04-10 DIAGNOSIS — D539 Nutritional anemia, unspecified: Secondary | ICD-10-CM

## 2020-04-10 DIAGNOSIS — Z0000 Encounter for general adult medical examination without abnormal findings: Secondary | ICD-10-CM | POA: Diagnosis not present

## 2020-04-10 DIAGNOSIS — E7211 Homocystinuria: Secondary | ICD-10-CM

## 2020-04-10 DIAGNOSIS — E7212 Methylenetetrahydrofolate reductase deficiency: Secondary | ICD-10-CM | POA: Diagnosis not present

## 2020-04-10 DIAGNOSIS — Z832 Family history of diseases of the blood and blood-forming organs and certain disorders involving the immune mechanism: Secondary | ICD-10-CM | POA: Diagnosis not present

## 2020-04-10 LAB — CBC WITH DIFFERENTIAL/PLATELET
Basophils Absolute: 0 10*3/uL (ref 0.0–0.1)
Basophils Relative: 0.4 % (ref 0.0–3.0)
Eosinophils Absolute: 0.1 10*3/uL (ref 0.0–0.7)
Eosinophils Relative: 1.2 % (ref 0.0–5.0)
HCT: 42.6 % (ref 36.0–46.0)
Hemoglobin: 14.4 g/dL (ref 12.0–15.0)
Lymphocytes Relative: 21.3 % (ref 12.0–46.0)
Lymphs Abs: 1.6 10*3/uL (ref 0.7–4.0)
MCHC: 33.8 g/dL (ref 30.0–36.0)
MCV: 86 fl (ref 78.0–100.0)
Monocytes Absolute: 0.4 10*3/uL (ref 0.1–1.0)
Monocytes Relative: 5.9 % (ref 3.0–12.0)
Neutro Abs: 5.4 10*3/uL (ref 1.4–7.7)
Neutrophils Relative %: 71.2 % (ref 43.0–77.0)
Platelets: 206 10*3/uL (ref 150.0–400.0)
RBC: 4.95 Mil/uL (ref 3.87–5.11)
RDW: 12.5 % (ref 11.5–15.5)
WBC: 7.5 10*3/uL (ref 4.0–10.5)

## 2020-04-10 LAB — FOLATE: Folate: 23.6 ng/mL (ref >5.9)

## 2020-04-10 LAB — FERRITIN: Ferritin: 28.8 ng/mL (ref 10.0–291.0)

## 2020-04-10 LAB — VITAMIN B12: Vitamin B-12: 717 pg/mL (ref 211–911)

## 2020-04-10 LAB — IRON: Iron: 120 ug/dL (ref 42–145)

## 2020-04-10 NOTE — Progress Notes (Signed)
Subjective:  Patient ID: Rachel Hernandez, female    DOB: 1980-11-07  Age: 40 y.o. MRN: [id number]  CC: Anemia and Annual Exam  This visit occurred during the SARS-CoV-2 public health emergency.  Safety protocols were in place, including screening questions prior to the visit, additional usage of staff PPE, and extensive cleaning of exam room while observing appropriate contact time as indicated for disinfecting solutions.    HPI Shelbey Spindler presents for a CPX.  She has a remote history of anemia and a family history of hypercoagulability.  She has a history of methylene DHF reductase deficiency and homocystinuria.  She took folate during a recent pregnancy.  She has felt well recently and offers no complaints.  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   She has a past surgical history that includes Dilation and curettage of uterus.   Her family history includes Arthritis in her maternal grandmother, paternal grandfather, and paternal grandmother; Atrial fibrillation in her father; Cancer in her paternal grandfather; Deep vein thrombosis in her maternal grandmother; Diabetes in her father and paternal grandmother; Hearing loss in her father, maternal grandfather, maternal grandmother, paternal grandfather, and paternal grandmother; Heart disease in her maternal grandfather, maternal grandmother, paternal grandfather, and paternal grandmother; Pulmonary embolism in her mother.She reports that she has never smoked. She has never used smokeless tobacco. She reports that she does not drink alcohol and does not use drugs.  Outpatient Medications Prior to Visit  Medication Sig Dispense Refill  . spironolactone (ALDACTONE) 100 MG tablet Take 100 mg by mouth daily.    Marland Kitchen HYDROcodone-homatropine (HYCODAN) 5-1.5 MG/5ML syrup Take 5 mLs by mouth every 6 (six) hours as needed for  cough. 90 mL 0  . predniSONE (STERAPRED UNI-PAK 21 TAB) 10 MG (21) TBPK tablet Take by mouth daily. Take as directed. 21 tablet 0   No facility-administered medications prior to visit.    ROS Review of Systems  Constitutional: Negative for diaphoresis, fatigue and unexpected weight change.  HENT: Negative.   Eyes: Negative for visual disturbance.  Respiratory: Negative for cough, chest tightness, shortness of breath and wheezing.   Cardiovascular: Negative for chest pain, palpitations and leg swelling.  Gastrointestinal: Negative for abdominal pain, diarrhea and nausea.  Endocrine: Negative.   Genitourinary: Negative.  Negative for difficulty urinating.  Musculoskeletal: Negative for arthralgias, back pain and myalgias.  Skin: Negative.  Negative for color change and pallor.  Neurological: Negative.  Negative for dizziness, weakness, light-headedness and headaches.  Hematological: Negative for adenopathy. Does not bruise/bleed easily.  Psychiatric/Behavioral: Negative.     Objective:  BP 106/72   Pulse 80   Temp 98.5 F (36.9 C) (Oral)   Resp 16   Ht 5\' 8"  (1.727 m)   Wt 145 lb (65.8 kg)   LMP 04/09/2020   SpO2 97%   BMI 22.05 kg/m   Physical Exam Vitals reviewed.  Constitutional:      Appearance: Normal appearance.  HENT:     Nose: Nose normal.     Mouth/Throat:     Mouth: Mucous membranes are moist.  Eyes:     General: No scleral icterus.    Conjunctiva/sclera: Conjunctivae normal.  Cardiovascular:     Rate and Rhythm: Normal rate and regular rhythm.     Heart sounds: No murmur heard.   Pulmonary:     Effort: Pulmonary effort is normal.     Breath sounds: No  stridor. No wheezing, rhonchi or rales.  Abdominal:     General: Abdomen is flat.     Palpations: There is no mass.     Tenderness: There is no abdominal tenderness. There is no guarding.  Musculoskeletal:        General: Normal range of motion.     Cervical back: Neck supple.     Right lower leg:  No edema.     Left lower leg: No edema.  Lymphadenopathy:     Cervical: No cervical adenopathy.  Skin:    General: Skin is warm and dry.     Coloration: Skin is not pale.  Neurological:     General: No focal deficit present.     Mental Status: She is alert and oriented to person, place, and time. Mental status is at baseline.  Psychiatric:        Mood and Affect: Mood normal.        Behavior: Behavior normal.     Lab Results  Component Value Date   WBC 7.5 04/10/2020   HGB 14.4 04/10/2020   HCT 42.6 04/10/2020   PLT 206.0 04/10/2020   CHOL 190 05/11/2019   TRIG 44 05/11/2019   HDL 48 05/11/2019   LDLCALC 133 05/11/2019   ALT 14 05/11/2019   AST 15 05/11/2019   NA 139 05/11/2019   K 4.4 05/11/2019   CL 105 05/11/2019   CREATININE 0.7 05/11/2019   BUN 12 05/11/2019   CO2 26 (A) 05/11/2019   TSH 1.49 05/11/2019    Assessment & Plan:   Aryanne was seen today for anemia and annual exam.  Diagnoses and all orders for this visit:  Family history of hypercoagulability- I will screen her for inherited risk of hypercoagulability. -     Hypercoagulable panel, comprehensive; Future -     Hypercoagulable panel, comprehensive  Deficiency anemia- Her H&H and vitamin levels are normal now. -     CBC with Differential/Platelet; Future -     Vitamin B12; Future -     Iron; Future -     Ferritin; Future -     Folate; Future -     CBC with Differential/Platelet -     Vitamin B12 -     Iron -     Ferritin -     Folate  Routine general medical examination at a health care facility- Exam completed, labs reviewed, vaccines reviewed and updated, cancer screenings are up-to-date, patient education was given.  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 -     Homocysteine; Future -     Homocysteine  Other orders -     Flu Vaccine QUAD 6+ mos PF IM (Fluarix Quad PF)   I have discontinued Estefany Gulino's  HYDROcodone-homatropine and predniSONE. I am also having her maintain her spironolactone.  No orders of the defined types were placed in this encounter.    Follow-up: Return in about 1 year (around 04/10/2021).  Sanda Linger, MD

## 2020-04-10 NOTE — Patient Instructions (Signed)
Health Maintenance, Female Adopting a healthy lifestyle and getting preventive care are important in promoting health and wellness. Ask your health care provider about:  The right schedule for you to have regular tests and exams.  Things you can do on your own to prevent diseases and keep yourself healthy. What should I know about diet, weight, and exercise? Eat a healthy diet  Eat a diet that includes plenty of vegetables, fruits, low-fat dairy products, and lean protein.  Do not eat a lot of foods that are high in solid fats, added sugars, or sodium.   Maintain a healthy weight Body mass index (BMI) is used to identify weight problems. It estimates body fat based on height and weight. Your health care provider can help determine your BMI and help you achieve or maintain a healthy weight. Get regular exercise Get regular exercise. This is one of the most important things you can do for your health. Most adults should:  Exercise for at least 150 minutes each week. The exercise should increase your heart rate and make you sweat (moderate-intensity exercise).  Do strengthening exercises at least twice a week. This is in addition to the moderate-intensity exercise.  Spend less time sitting. Even light physical activity can be beneficial. Watch cholesterol and blood lipids Have your blood tested for lipids and cholesterol at 38 years of age, then have this test every 5 years. Have your cholesterol levels checked more often if:  Your lipid or cholesterol levels are high.  You are older than 38 years of age.  You are at high risk for heart disease. What should I know about cancer screening? Depending on your health history and family history, you may need to have cancer screening at various ages. This may include screening for:  Breast cancer.  Cervical cancer.  Colorectal cancer.  Skin cancer.  Lung cancer. What should I know about heart disease, diabetes, and high blood  pressure? Blood pressure and heart disease  High blood pressure causes heart disease and increases the risk of stroke. This is more likely to develop in people who have high blood pressure readings, are of African descent, or are overweight.  Have your blood pressure checked: ? Every 3-5 years if you are 18-39 years of age. ? Every year if you are 40 years old or older. Diabetes Have regular diabetes screenings. This checks your fasting blood sugar level. Have the screening done:  Once every three years after age 40 if you are at a normal weight and have a low risk for diabetes.  More often and at a younger age if you are overweight or have a high risk for diabetes. What should I know about preventing infection? Hepatitis B If you have a higher risk for hepatitis B, you should be screened for this virus. Talk with your health care provider to find out if you are at risk for hepatitis B infection. Hepatitis C Testing is recommended for:  Everyone born from 1945 through 1965.  Anyone with known risk factors for hepatitis C. Sexually transmitted infections (STIs)  Get screened for STIs, including gonorrhea and chlamydia, if: ? You are sexually active and are younger than 38 years of age. ? You are older than 38 years of age and your health care provider tells you that you are at risk for this type of infection. ? Your sexual activity has changed since you were last screened, and you are at increased risk for chlamydia or gonorrhea. Ask your health care provider   if you are at risk.  Ask your health care provider about whether you are at high risk for HIV. Your health care provider may recommend a prescription medicine to help prevent HIV infection. If you choose to take medicine to prevent HIV, you should first get tested for HIV. You should then be tested every 3 months for as long as you are taking the medicine. Pregnancy  If you are about to stop having your period (premenopausal) and  you may become pregnant, seek counseling before you get pregnant.  Take 400 to 800 micrograms (mcg) of folic acid every day if you become pregnant.  Ask for birth control (contraception) if you want to prevent pregnancy. Osteoporosis and menopause Osteoporosis is a disease in which the bones lose minerals and strength with aging. This can result in bone fractures. If you are 65 years old or older, or if you are at risk for osteoporosis and fractures, ask your health care provider if you should:  Be screened for bone loss.  Take a calcium or vitamin D supplement to lower your risk of fractures.  Be given hormone replacement therapy (HRT) to treat symptoms of menopause. Follow these instructions at home: Lifestyle  Do not use any products that contain nicotine or tobacco, such as cigarettes, e-cigarettes, and chewing tobacco. If you need help quitting, ask your health care provider.  Do not use street drugs.  Do not share needles.  Ask your health care provider for help if you need support or information about quitting drugs. Alcohol use  Do not drink alcohol if: ? Your health care provider tells you not to drink. ? You are pregnant, may be pregnant, or are planning to become pregnant.  If you drink alcohol: ? Limit how much you use to 0-1 drink a day. ? Limit intake if you are breastfeeding.  Be aware of how much alcohol is in your drink. In the [place] one drink equals one 12 oz bottle of beer (355 mL), one 5 oz glass of wine (148 mL), or one 1 oz glass of hard liquor (44 mL). General instructions  Schedule regular health, dental, and eye exams.  Stay current with your vaccines.  Tell your health care provider if: ? You often feel depressed. ? You have ever been abused or do not feel safe at home. Summary  Adopting a healthy lifestyle and getting preventive care are important in promoting health and wellness.  Follow your health care provider's instructions about healthy  diet, exercising, and getting tested or screened for diseases.  Follow your health care provider's instructions on monitoring your cholesterol and blood pressure. This information is not intended to replace advice given to you by your health care provider. Make sure you discuss any questions you have with your health care provider. Document Revised: 02/24/2018 Document Reviewed: 02/24/2018 Elsevier Patient Education  2021 Elsevier Inc.  

## 2020-04-11 DIAGNOSIS — L819 Disorder of pigmentation, unspecified: Secondary | ICD-10-CM | POA: Diagnosis not present

## 2020-04-11 DIAGNOSIS — L700 Acne vulgaris: Secondary | ICD-10-CM | POA: Diagnosis not present

## 2020-04-11 DIAGNOSIS — L814 Other melanin hyperpigmentation: Secondary | ICD-10-CM | POA: Diagnosis not present

## 2020-04-11 DIAGNOSIS — D1801 Hemangioma of skin and subcutaneous tissue: Secondary | ICD-10-CM | POA: Diagnosis not present

## 2020-04-11 LAB — HOMOCYSTEINE: Homocysteine: 8 umol/L (ref <10.4)

## 2020-04-13 DIAGNOSIS — Z6821 Body mass index (BMI) 21.0-21.9, adult: Secondary | ICD-10-CM | POA: Diagnosis not present

## 2020-04-13 DIAGNOSIS — Z01419 Encounter for gynecological examination (general) (routine) without abnormal findings: Secondary | ICD-10-CM | POA: Diagnosis not present

## 2020-04-13 DIAGNOSIS — Z124 Encounter for screening for malignant neoplasm of cervix: Secondary | ICD-10-CM | POA: Diagnosis not present

## 2020-04-21 ENCOUNTER — Encounter: Payer: Self-pay | Admitting: Internal Medicine

## 2020-04-26 DIAGNOSIS — N939 Abnormal uterine and vaginal bleeding, unspecified: Secondary | ICD-10-CM | POA: Diagnosis not present

## 2020-05-07 LAB — HYPERCOAGULABLE PANEL, COMPREHENSIVE
APTT: 25.9 s
AT III Act/Nor PPP Chro: 115 %
Act. Prt C Resist w/FV Defic.: 2.9 ratio
Anticardiolipin Ab, IgG: <10 [GPL'U]
Anticardiolipin Ab, IgM: <10 [MPL'U]
Beta-2 Glycoprotein I, IgA: <10 SAU
Beta-2 Glycoprotein I, IgG: <10 SGU
Beta-2 Glycoprotein I, IgM: <10 SMU
DRVVT Screen Seconds: 35.6 s
Factor VII Antigen**: 93 %
Factor VIII Activity: 134 %
Hexagonal Phospholipid Neutral: 0 s
Homocysteine: 7 umol/L
Prot C Ag Act/Nor PPP Imm: 99 %
Prot S Ag Act/Nor PPP Imm: 84 %
Protein C Ag/FVII Ag Ratio**: 1.1 ratio
Protein S Ag/FVII Ag Ratio**: 0.9 ratio

## 2020-05-22 DIAGNOSIS — N939 Abnormal uterine and vaginal bleeding, unspecified: Secondary | ICD-10-CM | POA: Diagnosis not present

## 2020-07-10 DIAGNOSIS — K5900 Constipation, unspecified: Secondary | ICD-10-CM | POA: Diagnosis not present

## 2020-07-10 DIAGNOSIS — K625 Hemorrhage of anus and rectum: Secondary | ICD-10-CM | POA: Diagnosis not present

## 2020-07-18 DIAGNOSIS — H5213 Myopia, bilateral: Secondary | ICD-10-CM | POA: Diagnosis not present

## 2020-09-10 DIAGNOSIS — M79672 Pain in left foot: Secondary | ICD-10-CM | POA: Diagnosis not present

## 2020-09-10 DIAGNOSIS — M722 Plantar fascial fibromatosis: Secondary | ICD-10-CM | POA: Diagnosis not present

## 2020-09-26 DIAGNOSIS — M722 Plantar fascial fibromatosis: Secondary | ICD-10-CM | POA: Diagnosis not present

## 2020-09-26 DIAGNOSIS — M6701 Short Achilles tendon (acquired), right ankle: Secondary | ICD-10-CM | POA: Diagnosis not present

## 2020-10-15 DIAGNOSIS — S20219A Contusion of unspecified front wall of thorax, initial encounter: Secondary | ICD-10-CM | POA: Diagnosis not present

## 2020-11-07 DIAGNOSIS — Z20822 Contact with and (suspected) exposure to covid-19: Secondary | ICD-10-CM | POA: Diagnosis not present

## 2020-11-07 DIAGNOSIS — J209 Acute bronchitis, unspecified: Secondary | ICD-10-CM | POA: Diagnosis not present

## 2020-11-07 DIAGNOSIS — R051 Acute cough: Secondary | ICD-10-CM | POA: Diagnosis not present

## 2020-11-23 ENCOUNTER — Ambulatory Visit (INDEPENDENT_AMBULATORY_CARE_PROVIDER_SITE_OTHER): Payer: BC Managed Care – PPO | Admitting: Sports Medicine

## 2020-11-23 ENCOUNTER — Other Ambulatory Visit: Payer: Self-pay

## 2020-11-23 ENCOUNTER — Telehealth: Payer: Self-pay | Admitting: Internal Medicine

## 2020-11-23 ENCOUNTER — Ambulatory Visit: Payer: Self-pay

## 2020-11-23 ENCOUNTER — Ambulatory Visit: Payer: BC Managed Care – PPO | Admitting: Nurse Practitioner

## 2020-11-23 ENCOUNTER — Ambulatory Visit (INDEPENDENT_AMBULATORY_CARE_PROVIDER_SITE_OTHER): Payer: BC Managed Care – PPO

## 2020-11-23 VITALS — BP 100/68 | HR 95 | Ht 68.0 in

## 2020-11-23 DIAGNOSIS — M25571 Pain in right ankle and joints of right foot: Secondary | ICD-10-CM | POA: Diagnosis not present

## 2020-11-23 DIAGNOSIS — S93401A Sprain of unspecified ligament of right ankle, initial encounter: Secondary | ICD-10-CM

## 2020-11-23 NOTE — Patient Instructions (Addendum)
Good to see you  Crutches for weight baring  Can progress to weight baring as tolerated in boot over next 2-4 days Can transition out of boot into comfortable tennis shoe and lace up brace as tolerated in the next 1-2 weeks RICE therapy  Tylenol and ibuprofen as needed for pain control  No tennis for 2 weeks minimum See me again in 2 weeks

## 2020-11-23 NOTE — Telephone Encounter (Signed)
Pt advised that she will need to come to office for assessment. Appt made for 1120 with NP

## 2020-11-23 NOTE — Telephone Encounter (Signed)
Right ankle complete  She rolled it on the tennis court today and wants an x-ray  Patient is with Dorise Bullion who works for [employer] the patient with any questions

## 2020-11-23 NOTE — Progress Notes (Signed)
    Rachel Hernandez D.[facility] [address] [place] [address] Phone: [phone]   Assessment and Plan:     1. Acute right ankle pain 2. Inversion sprain of right ankle, initial encounter -Acute, initial sports medicine visit -Likely grade 2 ankle sprain of ATFL and CFL on right with history of ankle sprains - X-ray completed in clinic.  My interpretation: Soft tissue edema over lateral aspect of foot.  Cortical irregularity of unknown chronicity near talus, joint space maintained, no syndesmotic widening, no acute fracture -Crutches for weight baring  Can progress to weight baring as tolerated in boot over next 2-4 days Can transition out of boot into comfortable tennis shoe and lace up brace as tolerated in the next 1-2 weeks RICE therapy  Tylenol and ibuprofen as needed for pain control  No tennis for 2 weeks minimum - DG Ankle Complete Right; Future - [place] LIMITED JOINT SPACE STRUCTURES LOW RIGHT(NO LINKED CHARGES); Future     Follow Up: In 2 weeks for reevaluation   Subjective:   I, Rachel Hernandez, am serving as a scribe for Dr. Richardean Sale  Chief Complaint: Right ankle pain   HPI: 40 year old patient presenting with Right ankle pain   11/23/20 Patient states that she was playing tennis and rolled her right ankle and heard two pops and has trouble walking may need to ambulate with crutches or wheel chair. Patient. Patient does have swelling and locates pain to lateral R ankle that radiates down the foot and to the top. Patient has iced the area for 30 minutes.  History of bilateral ankle sprains in high school, but none recently.  No history of fracture.  Relevant Historical Information: None  Additional pertinent review of systems negative.   Current Outpatient Medications:    spironolactone (ALDACTONE) 100 MG tablet, Take 100 mg by mouth daily., Disp: , Rfl:    Objective:     Vitals:   11/23/20 1129  BP: 100/68  Pulse:  95  SpO2: 98%  Height: 5\' 8"  (1.727 m)      Body mass index is 22.05 kg/m.    Physical Exam:    Gen: Appears well, nad, nontoxic and pleasant Psych: Alert and oriented, appropriate mood and affect Neuro: sensation intact, strength is 5/5 with df/pf/inv/ev, muscle tone wnl Skin: no susupicious lesions or rashes  Right ankle: no deformity Lateralized edema developing over ATFL distribution.  No ecchymosis Moderate TTP over ATFL and CFL NTTP over fibular head, lat mal, medial mal, achilles, navicular, base of 5th,deltoid, calcaneous or midfoot ROM DF 15, PF 30, inv/ev intact though decreased by pain Negative ant drawer, squeeze test. Talar tilt caused pain, but no instability felt Neg thompson Moderate pain with resisted inversion and mild with eversion    Electronically signed by:  D.[facility] 12:45 PM 11/23/20

## 2020-12-14 DIAGNOSIS — M79671 Pain in right foot: Secondary | ICD-10-CM | POA: Diagnosis not present

## 2020-12-14 DIAGNOSIS — S93491A Sprain of other ligament of right ankle, initial encounter: Secondary | ICD-10-CM | POA: Diagnosis not present

## 2020-12-14 DIAGNOSIS — M25571 Pain in right ankle and joints of right foot: Secondary | ICD-10-CM | POA: Diagnosis not present

## 2020-12-25 DIAGNOSIS — S93491D Sprain of other ligament of right ankle, subsequent encounter: Secondary | ICD-10-CM | POA: Diagnosis not present

## 2021-01-01 DIAGNOSIS — S93491D Sprain of other ligament of right ankle, subsequent encounter: Secondary | ICD-10-CM | POA: Diagnosis not present

## 2021-01-09 DIAGNOSIS — S93491D Sprain of other ligament of right ankle, subsequent encounter: Secondary | ICD-10-CM | POA: Diagnosis not present

## 2021-01-22 DIAGNOSIS — S93491D Sprain of other ligament of right ankle, subsequent encounter: Secondary | ICD-10-CM | POA: Diagnosis not present

## 2021-02-12 DIAGNOSIS — S93491D Sprain of other ligament of right ankle, subsequent encounter: Secondary | ICD-10-CM | POA: Diagnosis not present

## 2021-04-11 DIAGNOSIS — D2261 Melanocytic nevi of right upper limb, including shoulder: Secondary | ICD-10-CM | POA: Diagnosis not present

## 2021-04-11 DIAGNOSIS — L700 Acne vulgaris: Secondary | ICD-10-CM | POA: Diagnosis not present

## 2021-04-11 DIAGNOSIS — D2262 Melanocytic nevi of left upper limb, including shoulder: Secondary | ICD-10-CM | POA: Diagnosis not present

## 2021-04-11 DIAGNOSIS — L308 Other specified dermatitis: Secondary | ICD-10-CM | POA: Diagnosis not present

## 2021-04-25 DIAGNOSIS — Z124 Encounter for screening for malignant neoplasm of cervix: Secondary | ICD-10-CM | POA: Diagnosis not present

## 2021-04-25 DIAGNOSIS — Z01411 Encounter for gynecological examination (general) (routine) with abnormal findings: Secondary | ICD-10-CM | POA: Diagnosis not present

## 2021-04-25 DIAGNOSIS — Z01419 Encounter for gynecological examination (general) (routine) without abnormal findings: Secondary | ICD-10-CM | POA: Diagnosis not present

## 2021-04-25 DIAGNOSIS — Z6822 Body mass index (BMI) 22.0-22.9, adult: Secondary | ICD-10-CM | POA: Diagnosis not present

## 2021-07-24 DIAGNOSIS — Z20822 Contact with and (suspected) exposure to covid-19: Secondary | ICD-10-CM | POA: Diagnosis not present

## 2021-07-24 DIAGNOSIS — R051 Acute cough: Secondary | ICD-10-CM | POA: Diagnosis not present

## 2021-07-24 DIAGNOSIS — R509 Fever, unspecified: Secondary | ICD-10-CM | POA: Diagnosis not present

## 2021-07-24 DIAGNOSIS — R519 Headache, unspecified: Secondary | ICD-10-CM | POA: Diagnosis not present

## 2021-07-26 DIAGNOSIS — J0141 Acute recurrent pansinusitis: Secondary | ICD-10-CM | POA: Diagnosis not present

## 2021-07-26 DIAGNOSIS — J039 Acute tonsillitis, unspecified: Secondary | ICD-10-CM | POA: Diagnosis not present

## 2021-07-26 DIAGNOSIS — B9689 Other specified bacterial agents as the cause of diseases classified elsewhere: Secondary | ICD-10-CM | POA: Diagnosis not present

## 2021-07-26 DIAGNOSIS — J208 Acute bronchitis due to other specified organisms: Secondary | ICD-10-CM | POA: Diagnosis not present

## 2022-02-14 IMAGING — DX DG ANKLE COMPLETE 3+V*R*
3 series · 3 of 3 positions shown · non-contrast
Comparison: None.

CLINICAL DATA: Right ankle pain laterally following twisting
injury, initial encounter

EXAM:
RIGHT ANKLE - COMPLETE 3+ VIEW

[ankle ap]
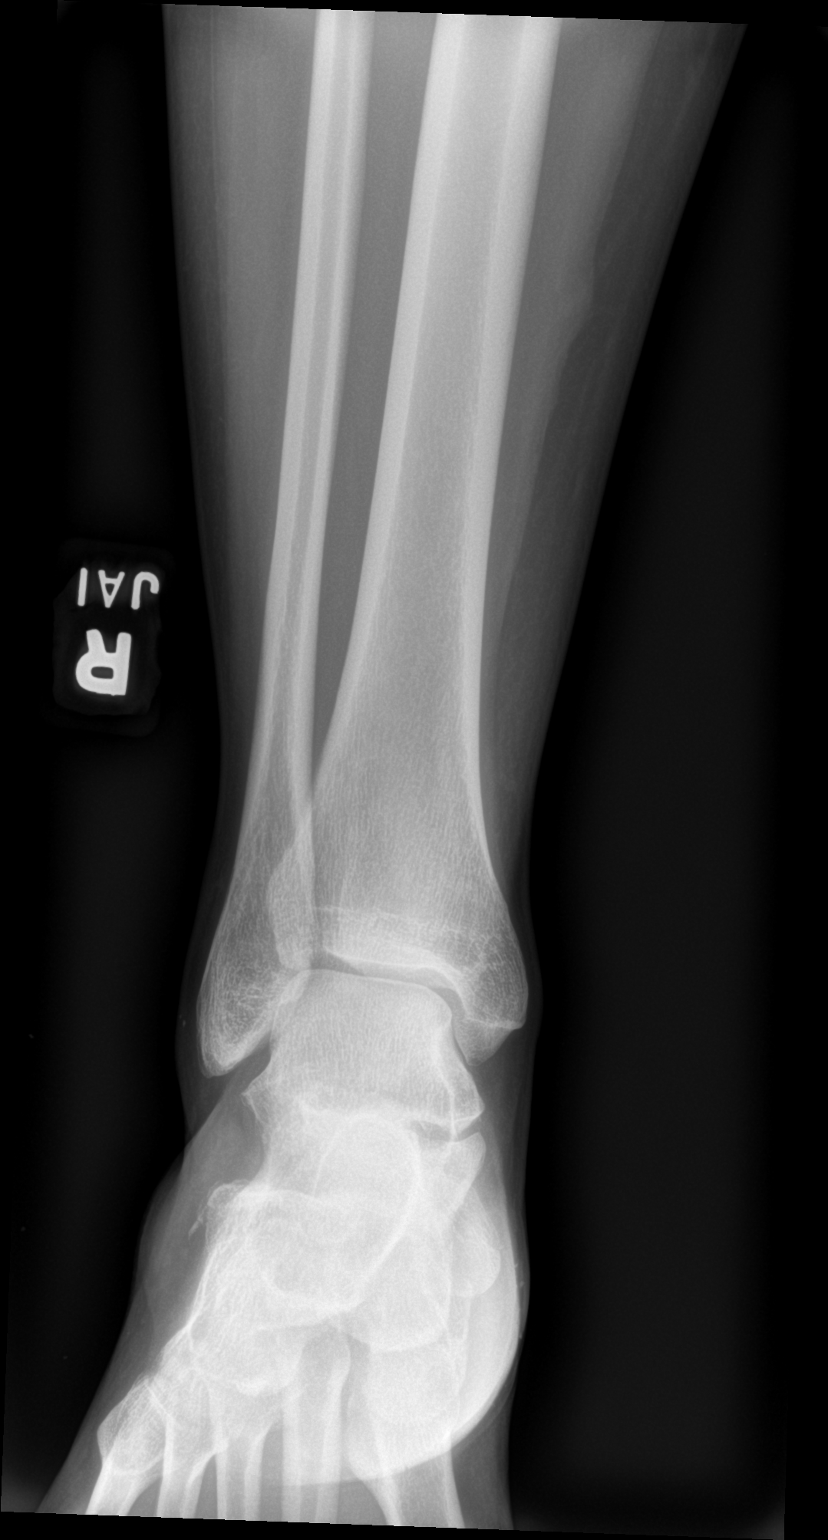

[ankle obl]
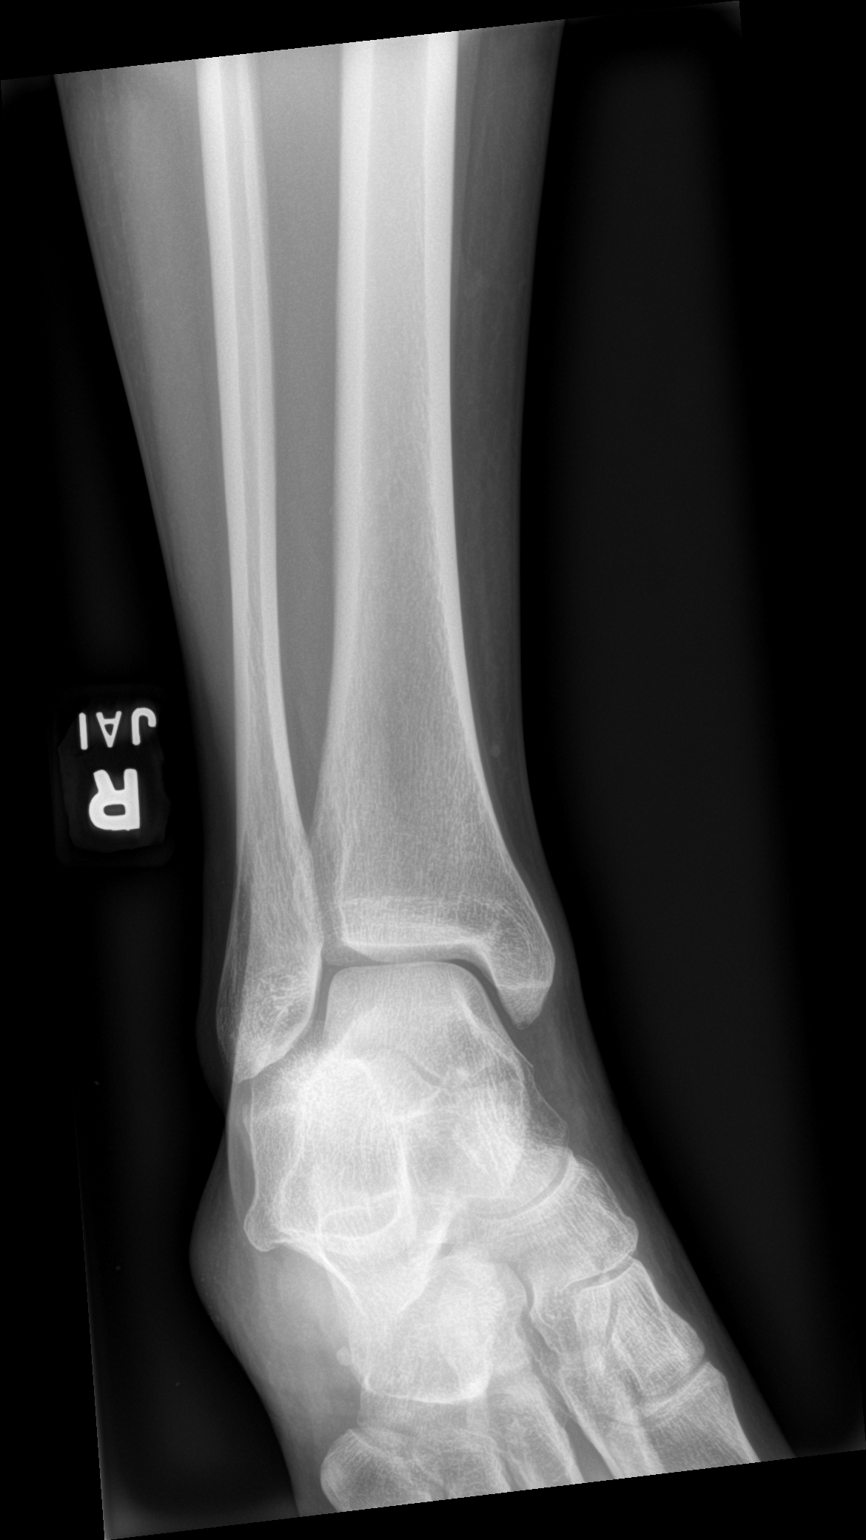

[ankle lat]
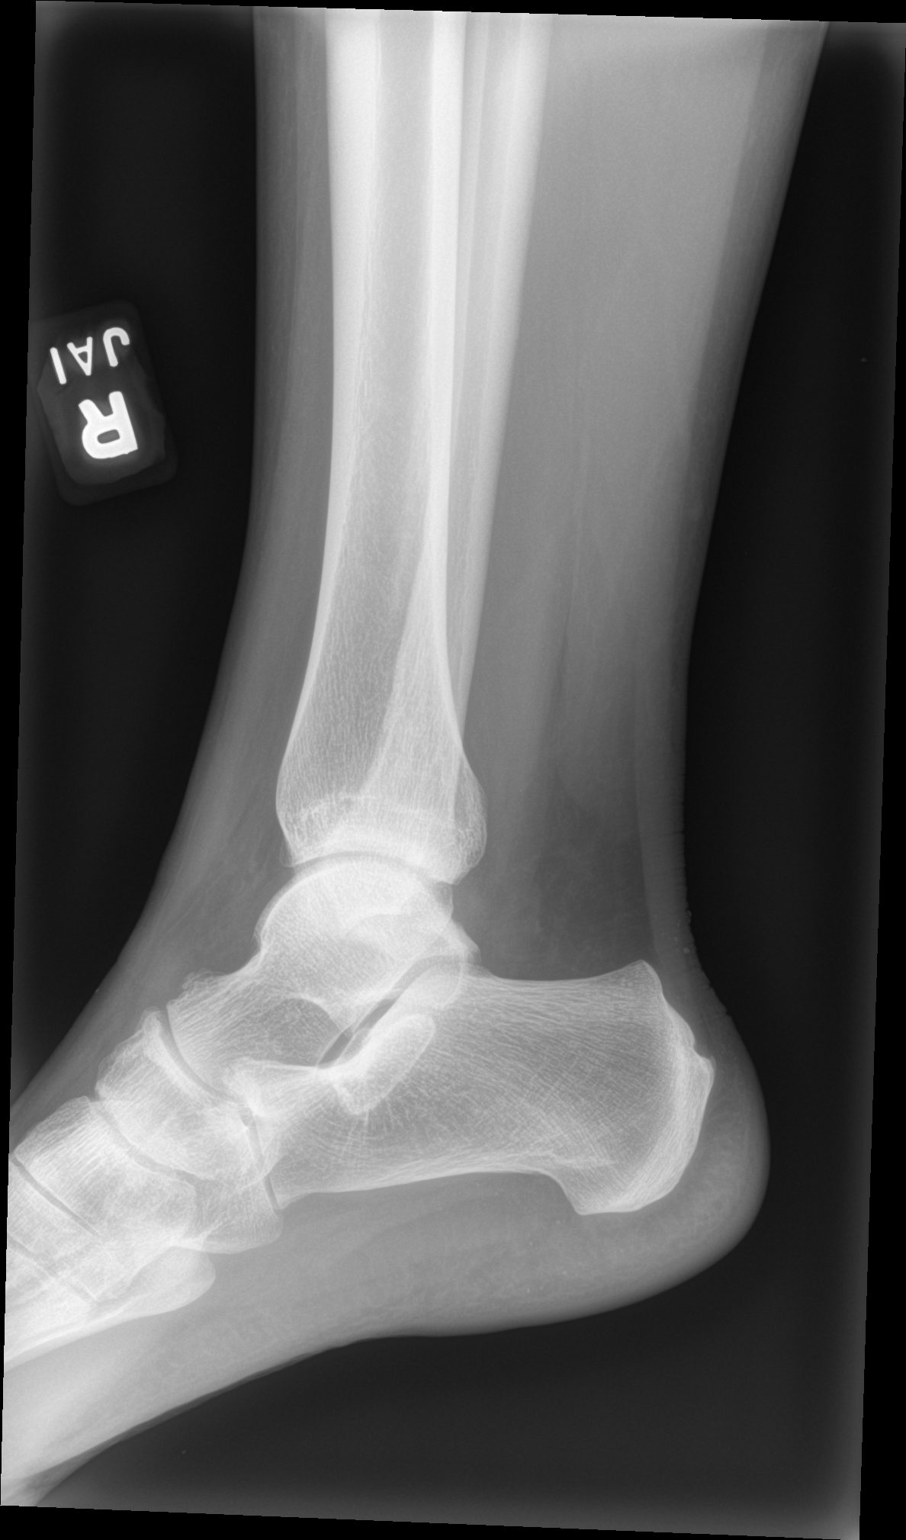

[3 of 3 positions shown; findings below may reference images not displayed]

FINDINGS: There is a linear bony density along the lateral aspect of the
tarsal bones suggestive of avulsion fracture seen only on the
frontal film. No other fracture is seen. Well corticated bony
density is noted along the dorsal aspect of the talus likely related
to prior trauma. No significant soft tissue abnormality is noted.
IMPRESSION: Findings suspicious for avulsion fracture from the lateral aspect of
the tarsal bones likely related to the distal calcaneus.

## 2023-04-09 ENCOUNTER — Ambulatory Visit
Admission: RE | Admit: 2023-04-09 | Discharge: 2023-04-09 | Disposition: A | Discharge status: Home / Self Care | Payer: BC Managed Care – PPO | Source: Ambulatory Visit | Attending: Family Medicine | Admitting: Family Medicine

## 2023-04-09 ENCOUNTER — Other Ambulatory Visit: Payer: Self-pay | Admitting: Family Medicine

## 2023-04-09 DIAGNOSIS — M545 Low back pain, unspecified: Secondary | ICD-10-CM

## 2023-04-09 DIAGNOSIS — M25551 Pain in right hip: Secondary | ICD-10-CM

## 2023-11-17 ENCOUNTER — Encounter: Payer: Self-pay | Admitting: Family Medicine

## 2023-11-17 ENCOUNTER — Other Ambulatory Visit: Payer: Self-pay | Admitting: Family Medicine

## 2023-11-17 DIAGNOSIS — C50912 Malignant neoplasm of unspecified site of left female breast: Secondary | ICD-10-CM

## 2023-11-18 ENCOUNTER — Ambulatory Visit
Admission: RE | Admit: 2023-11-18 | Discharge: 2023-11-18 | Disposition: A | Discharge status: Home / Self Care | Source: Ambulatory Visit | Attending: Family Medicine | Admitting: Family Medicine

## 2023-11-18 DIAGNOSIS — C50912 Malignant neoplasm of unspecified site of left female breast: Secondary | ICD-10-CM

## 2023-11-18 MED ORDER — GADOPICLENOL 0.5 MMOL/ML IV SOLN
6.0000 mL | Freq: Once | INTRAVENOUS | Status: AC | PRN
  Administered 2023-11-18: 6 mL via INTRAVENOUS

## 2024-03-22 ENCOUNTER — Emergency Department (HOSPITAL_COMMUNITY)
Admission: EM | Admit: 2024-03-22 | Discharge: 2024-03-22 | Disposition: A | Discharge status: Home / Self Care | Attending: Emergency Medicine | Admitting: Emergency Medicine

## 2024-03-22 ENCOUNTER — Other Ambulatory Visit: Payer: Self-pay

## 2024-03-22 ENCOUNTER — Emergency Department (HOSPITAL_COMMUNITY)

## 2024-03-22 DIAGNOSIS — J101 Influenza due to other identified influenza virus with other respiratory manifestations: Secondary | ICD-10-CM | POA: Insufficient documentation

## 2024-03-22 DIAGNOSIS — E860 Dehydration: Secondary | ICD-10-CM | POA: Diagnosis not present

## 2024-03-22 DIAGNOSIS — C50919 Malignant neoplasm of unspecified site of unspecified female breast: Secondary | ICD-10-CM | POA: Insufficient documentation

## 2024-03-22 DIAGNOSIS — J111 Influenza due to unidentified influenza virus with other respiratory manifestations: Secondary | ICD-10-CM

## 2024-03-22 DIAGNOSIS — R059 Cough, unspecified: Secondary | ICD-10-CM | POA: Diagnosis present

## 2024-03-22 LAB — COMPREHENSIVE METABOLIC PANEL WITH GFR
ALT: 23 U/L (ref 0–44)
AST: 27 U/L (ref 15–41)
Albumin: 4.6 g/dL (ref 3.5–5.0)
Alkaline Phosphatase: 89 U/L (ref 38–126)
Anion gap: 13 (ref 5–15)
BUN: 11 mg/dL (ref 6–20)
CO2: 25 mmol/L (ref 22–32)
Calcium: 9.2 mg/dL (ref 8.9–10.3)
Chloride: 102 mmol/L (ref 98–111)
Creatinine, Ser: 0.65 mg/dL (ref 0.44–1.00)
GFR, Estimated: >60 mL/min
Glucose, Bld: 93 mg/dL (ref 70–99)
Potassium: 3.6 mmol/L (ref 3.5–5.1)
Sodium: 139 mmol/L (ref 135–145)
Total Bilirubin: 0.3 mg/dL (ref 0.0–1.2)
Total Protein: 7.2 g/dL (ref 6.5–8.1)

## 2024-03-22 LAB — CBC WITH DIFFERENTIAL/PLATELET
Abs Immature Granulocytes: 1.05 K/uL — ABNORMAL HIGH (ref 0.00–0.07)
Basophils Absolute: 0 K/uL (ref 0.0–0.1)
Basophils Relative: 0 %
Eosinophils Absolute: 0 K/uL (ref 0.0–0.5)
Eosinophils Relative: 0 %
HCT: 35.7 % — ABNORMAL LOW (ref 36.0–46.0)
Hemoglobin: 12.1 g/dL (ref 12.0–15.0)
Immature Granulocytes: 13 %
Lymphocytes Relative: 5 %
Lymphs Abs: 0.4 K/uL — ABNORMAL LOW (ref 0.7–4.0)
MCH: 29.5 pg (ref 26.0–34.0)
MCHC: 33.9 g/dL (ref 30.0–36.0)
MCV: 87.1 fL (ref 80.0–100.0)
Monocytes Absolute: 0.7 K/uL (ref 0.1–1.0)
Monocytes Relative: 9 %
Neutro Abs: 5.8 K/uL (ref 1.7–7.7)
Neutrophils Relative %: 73 %
Platelets: 88 K/uL — ABNORMAL LOW (ref 150–400)
RBC: 4.1 MIL/uL (ref 3.87–5.11)
RDW: 14.6 % (ref 11.5–15.5)
WBC: 8 K/uL (ref 4.0–10.5)
nRBC: 0 % (ref 0.0–0.2)

## 2024-03-22 LAB — URINALYSIS, W/ REFLEX TO CULTURE (INFECTION SUSPECTED)
Bacteria, UA: NONE SEEN
Bilirubin Urine: NEGATIVE
Glucose, UA: NEGATIVE mg/dL
Ketones, ur: 5 mg/dL — AB
Leukocytes,Ua: NEGATIVE
Nitrite: NEGATIVE
Protein, ur: NEGATIVE mg/dL
Specific Gravity, Urine: 1.021 (ref 1.005–1.030)
pH: 5 (ref 5.0–8.0)

## 2024-03-22 LAB — I-STAT CG4 LACTIC ACID, ED: Lactic Acid, Venous: 0.8 mmol/L (ref 0.5–1.9)

## 2024-03-22 LAB — GROUP A STREP BY PCR: Group A Strep by PCR: NOT DETECTED

## 2024-03-22 LAB — HCG, SERUM, QUALITATIVE: Preg, Serum: NEGATIVE

## 2024-03-22 LAB — RESP PANEL BY RT-PCR (RSV, FLU A&B, COVID)  RVPGX2
Influenza A by PCR: POSITIVE — AB
Influenza B by PCR: NEGATIVE
Resp Syncytial Virus by PCR: NEGATIVE
SARS Coronavirus 2 by RT PCR: NEGATIVE

## 2024-03-22 MED ORDER — KETOROLAC TROMETHAMINE 15 MG/ML IJ SOLN
15.0000 mg | Freq: Once | INTRAMUSCULAR | Status: AC
  Administered 2024-03-22: 15 mg via INTRAVENOUS
  Filled 2024-03-22: qty 1

## 2024-03-22 MED ORDER — ACETAMINOPHEN 500 MG PO TABS
1000.0000 mg | ORAL_TABLET | Freq: Once | ORAL | Status: AC
  Administered 2024-03-22: 1000 mg via ORAL
  Filled 2024-03-22: qty 2

## 2024-03-22 MED ORDER — PROMETHAZINE-DM 6.25-15 MG/5ML PO SYRP: 5.0000 mL | ORAL_SOLUTION | Freq: Four times a day (QID) | ORAL | 0 refills | Status: AC | PRN

## 2024-03-22 MED ORDER — LACTATED RINGERS IV BOLUS
1000.0000 mL | Freq: Once | INTRAVENOUS | Status: AC
  Administered 2024-03-22: 1000 mL via INTRAVENOUS

## 2024-03-22 NOTE — Discharge Instructions (Addendum)
 I have provided you some generic information below however like you to monitor your symptoms closely make sure you are drinking plenty of water Gatorade Pedialyte etc. to stay hydrated.  Take Tylenol  and ibuprofen  as discussed below.   Please use Tylenol  or ibuprofen  for pain.  You may use 600 mg ibuprofen  every 6 hours or 1000 mg of Tylenol  every 6 hours.  You may choose to alternate between the 2.  This would be most effective.  Not to exceed 4 g of Tylenol  within 24 hours.  Not to exceed 3200 mg ibuprofen  24 hours.   Viral Illness TREATMENT  Treatment is directed at relieving symptoms. There is no cure. Antibiotics are not effective, because the infection is caused by a virus, not by bacteria. Treatment may include:  Increased fluid intake. Sports drinks offer valuable electrolytes, sugars, and fluids.  Breathing heated mist or steam (vaporizer or shower).  Eating chicken soup or other clear broths, and maintaining good nutrition.  Getting plenty of rest.  Using gargles or lozenges for comfort.  Increasing usage of your inhaler if you have asthma.  Return to work when your temperature has returned to normal.  Gargle warm salt water and spit it out for sore throat. Take benadryl  to decrease sinus secretions. Continue to alternate between Tylenol  and ibuprofen  for pain and fever control.  Follow Up: Follow up with your primary care doctor in 5-7 days for recheck of ongoing symptoms.  Return to emergency department for emergent changing or worsening of symptoms.

## 2024-03-22 NOTE — ED Provider Notes (Signed)
 " [place] EMERGENCY DEPARTMENT AT [facility] Provider Note   CSN: [id number] Arrival date & time: 03/22/24  1816     Patient presents with: Fever and Generalized Body Aches   Rachel Hernandez is a 44 y.o. female.  {Add pertinent medical, surgical, social history, OB history to HPI:32947}  Fever         Prior to Admission medications  Medication Sig Start Date End Date Taking? Authorizing Provider  spironolactone (ALDACTONE) 100 MG tablet Take 100 mg by mouth daily.    [provider]    Allergies: Patient has no known allergies.    Review of Systems  Constitutional:  Positive for fever.    Updated Vital Signs BP 111/69 (BP Location: Right Arm)   Pulse 96   Temp 98 F (36.7 C) (Oral)   Resp 11   SpO2 98%   Physical Exam  (all labs ordered are listed, but only abnormal results are displayed) Labs Reviewed  RESP PANEL BY RT-PCR (RSV, FLU A&B, COVID)  RVPGX2 - Abnormal; Notable for the following components:      Result Value   Influenza A by PCR POSITIVE (*)    All other components within normal limits  CBC WITH DIFFERENTIAL/PLATELET - Abnormal; Notable for the following components:   HCT 35.7 (*)    Platelets 88 (*)    Lymphs Abs 0.4 (*)    Abs Immature Granulocytes 1.05 (*)    All other components within normal limits  URINALYSIS, W/ REFLEX TO CULTURE (INFECTION SUSPECTED) - Abnormal; Notable for the following components:   APPearance HAZY (*)    Hgb urine dipstick SMALL (*)    Ketones, ur 5 (*)    All other components within normal limits  GROUP A STREP BY PCR  CULTURE, BLOOD (ROUTINE X 2)  CULTURE, BLOOD (ROUTINE X 2)  COMPREHENSIVE METABOLIC PANEL WITH GFR  HCG, SERUM, QUALITATIVE  I-STAT CG4 LACTIC ACID, ED    EKG: None  Radiology: DG Chest 2 View Result Date: 03/22/2024 EXAM: 2 VIEW(S) XRAY OF THE CHEST 03/22/2024 07:07:09 PM COMPARISON: 03/07/2019 CLINICAL HISTORY: Cough. FINDINGS: LINES, TUBES AND DEVICES: Right  port-a-cath in place with the tip at the cavoatrial junction. LUNGS AND PLEURA: No focal pulmonary opacity. No pleural effusion. No pneumothorax. HEART AND MEDIASTINUM: No acute abnormality of the cardiac and mediastinal silhouettes. BONES AND SOFT TISSUES: No acute osseous abnormality. IMPRESSION: 1. No active cardiopulmonary disease. Electronically signed by: Franky Crease MD 03/22/2024 07:24 PM EST RP Workstation: [id number]    {Document cardiac monitor, telemetry assessment procedure when appropriate:32947} Procedures   Medications Ordered in the ED  lactated ringers  bolus 1,000 mL (has no administration in time range)  lactated ringers  bolus 1,000 mL (0 mLs Intravenous Stopped 03/22/24 2140)  acetaminophen  (TYLENOL ) tablet 1,000 mg (1,000 mg Oral Given 03/22/24 1954)  ketorolac  (TORADOL ) 15 MG/ML injection 15 mg (15 mg Intravenous Given 03/22/24 2140)    Clinical Course as of 03/22/24 2224  Tue Mar 22, 2024  1930 Cough sore throat fatigue decreased appetite and bodyaches since patient had a temperature of 101 at home as well.  [WF]    Clinical Course User Index [WF] Neldon Hamp RAMAN, PA   {Click here for ABCD2, HEART and other calculators REFRESH Note before signing:1}                              Medical Decision Making Amount and/or Complexity of Data Reviewed  Labs: ordered. Radiology: ordered.  Risk OTC drugs. Prescription drug management.   ***  {Document critical care time when appropriate  Document review of labs and clinical decision tools ie CHADS2VASC2, etc  Document your independent review of radiology images and any outside records  Document your discussion with family members, caretakers and with consultants  Document social determinants of health affecting pt's care  Document your decision making why or why not admission, treatments were needed:32947:::1}   Final diagnoses:  Influenza  Dehydration    ED Discharge Orders     None        "

## 2024-03-22 NOTE — ED Triage Notes (Signed)
 Patient c/o sore throat and fever x 1 week. Patient report worsening symptoms today. Patient report chest tightness denies SOB. Hx breast cancer , last chemo treatment 12/23.

## 2024-03-22 NOTE — ED Provider Triage Note (Signed)
 Emergency Medicine Provider Triage Evaluation Note  Rachel Hernandez , a 44 y.o. female  was evaluated in triage.  Pt complains of fever, cough.  Patient is a breast cancer patient who presents after 1 week of sore throat, fever, development of worsening cough.  Reportedly was advised to come in for evaluation due to persistent symptoms.  Denies any feelings of shortness of breath.  States that her family's been sick with similar symptoms but has had minimal contact with her spouse and children.  Does report that she was around her mom last week who is positive for influenza A.  Review of Systems  Positive: As above Negative: As above  Physical Exam  BP 126/85   Pulse (!) 140   Temp 98.9 F (37.2 C)   Resp (!) 23   SpO2 100%  Gen:   Awake, no distress   Resp:  Normal effort  MSK:   Moves extremities without difficulty  Other:    Medical Decision Making  Medically screening exam initiated at 6:58 PM.  Appropriate orders placed.  Rachel Hernandez was informed that the remainder of the evaluation will be completed by another provider, this initial triage assessment does not replace that evaluation, and the importance of remaining in the ED until their evaluation is complete.     [name] A, PA-C 03/22/24 1859

## 2024-03-22 NOTE — ED Notes (Signed)
 Patient want her port access for blood work.

## 2024-03-22 NOTE — ED Notes (Signed)
 ED Provider at bedside.

## 2024-03-22 NOTE — ED Provider Notes (Incomplete)
 " [place] EMER[facility]ENCY DEPARTMENT AT [facility] Provider Note   CSN: [id number] Arrival date & time: 03/22/24  1816     Patient presents with: Fever and Generalized Body Aches   Rachel Hernandez is a 44 y.o. female.  {Add pertinent medical, surgical, social history, OB history to HPI:32947}  Fever         Prior to Admission medications  Medication Sig Start Date End Date Taking? Authorizing Provider  spironolactone (ALDACTONE) 100 MG tablet Take 100 mg by mouth daily.    [provider]    Allergies: Patient has no known allergies.    Review of Systems  Constitutional:  Positive for fever.    Updated Vital Signs BP 111/69 (BP Location: Right Arm)   Pulse 96   Temp 98 F (36.7 C) (Oral)   Resp 11   SpO2 98%   Physical Exam  (all labs ordered are listed, but only abnormal results are displayed) Labs Reviewed  RESP PANEL BY RT-PCR (RSV, FLU A&B, COVID)  RVPGX2 - Abnormal; Notable for the following components:      Result Value   Influenza A by PCR POSITIVE (*)    All other components within normal limits  CBC WITH DIFFERENTIAL/PLATELET - Abnormal; Notable for the following components:   HCT 35.7 (*)    Platelets 88 (*)    Lymphs Abs 0.4 (*)    Abs Immature Granulocytes 1.05 (*)    All other components within normal limits  URINALYSIS, W/ REFLEX TO CULTURE (INFECTION SUSPECTED) - Abnormal; Notable for the following components:   APPearance HAZY (*)    Hgb urine dipstick SMALL (*)    Ketones, ur 5 (*)    All other components within normal limits  GROUP A STREP BY PCR  CULTURE, BLOOD (ROUTINE X 2)  CULTURE, BLOOD (ROUTINE X 2)  COMPREHENSIVE METABOLIC PANEL WITH GFR  HCG, SERUM, QUALITATIVE  I-STAT CG4 LACTIC ACID, ED    EKG: None  Radiology: DG Chest 2 View Result Date: 03/22/2024 EXAM: 2 VIEW(S) XRAY OF THE CHEST 03/22/2024 07:07:09 PM COMPARISON: 03/07/2019 CLINICAL HISTORY: Cough. FINDINGS: LINES, TUBES AND DEVICES: Right  port-a-cath in place with the tip at the cavoatrial junction. LUNGS AND PLEURA: No focal pulmonary opacity. No pleural effusion. No pneumothorax. HEART AND MEDIASTINUM: No acute abnormality of the cardiac and mediastinal silhouettes. BONES AND SOFT TISSUES: No acute osseous abnormality. IMPRESSION: 1. No active cardiopulmonary disease. Electronically signed by: Franky Crease MD 03/22/2024 07:24 PM EST RP Workstation: [id number]    {Document cardiac monitor, telemetry assessment procedure when appropriate:32947} Procedures   Medications Ordered in the ED  lactated ringers  bolus 1,000 mL (has no administration in time range)  lactated ringers  bolus 1,000 mL (0 mLs Intravenous Stopped 03/22/24 2140)  acetaminophen  (TYLENOL ) tablet 1,000 mg (1,000 mg Oral Given 03/22/24 1954)  ketorolac  (TORADOL ) 15 MG/ML injection 15 mg (15 mg Intravenous Given 03/22/24 2140)    Clinical Course as of 03/22/24 2224  Tue Mar 22, 2024  1930 Cough sore throat fatigue decreased appetite and bodyaches since patient had a temperature of 101 at home as well.  [WF]    Clinical Course User Index [WF] Neldon Hamp RAMAN, PA   {Click here for ABCD2, HEART and other calculators REFRESH Note before signing:1}                              Medical Decision Making Amount and/or Complexity of Data Reviewed  Labs: ordered. Radiology: ordered.  Risk OTC drugs. Prescription drug management.   ***  {Document critical care time when appropriate  Document review of labs and clinical decision tools ie CHADS2VASC2, etc  Document your independent review of radiology images and any outside records  Document your discussion with family members, caretakers and with consultants  Document social determinants of health affecting pt's care  Document your decision making why or why not admission, treatments were needed:32947:::1}   Final diagnoses:  Influenza  Dehydration    ED Discharge Orders     None        "

## 2024-03-27 LAB — CULTURE, BLOOD (ROUTINE X 2)
Culture: NO GROWTH
Culture: NO GROWTH
